# Patient Record
Sex: Male | Born: 1955 | Hispanic: No | Smoking: Former smoker
Health system: Southern US, Community
[De-identification: ages and names within clinical notes are randomized; demographics above are authoritative.]

## PROBLEM LIST (undated history)

## (undated) DIAGNOSIS — F1011 Alcohol abuse, in remission: Secondary | ICD-10-CM

## (undated) DIAGNOSIS — N410 Acute prostatitis: Secondary | ICD-10-CM

## (undated) DIAGNOSIS — B009 Herpesviral infection, unspecified: Secondary | ICD-10-CM

## (undated) DIAGNOSIS — K219 Gastro-esophageal reflux disease without esophagitis: Secondary | ICD-10-CM

## (undated) DIAGNOSIS — D219 Benign neoplasm of connective and other soft tissue, unspecified: Secondary | ICD-10-CM

## (undated) DIAGNOSIS — B351 Tinea unguium: Secondary | ICD-10-CM

## (undated) HISTORY — DX: Tinea unguium: B35.1

## (undated) HISTORY — DX: Alcohol abuse, in remission: F10.11

## (undated) HISTORY — DX: Benign neoplasm of connective and other soft tissue, unspecified: D21.9

## (undated) HISTORY — DX: Acute prostatitis: N41.0

## (undated) HISTORY — DX: Gastro-esophageal reflux disease without esophagitis: K21.9

## (undated) HISTORY — DX: Herpesviral infection, unspecified: B00.9

---

## 2000-06-11 ENCOUNTER — Emergency Department (HOSPITAL_COMMUNITY): Admission: EM | Admit: 2000-06-11 | Discharge: 2000-06-11 | Payer: Self-pay | Admitting: Emergency Medicine

## 2000-06-11 ENCOUNTER — Encounter: Payer: Self-pay | Admitting: Emergency Medicine

## 2005-02-24 ENCOUNTER — Ambulatory Visit: Payer: Self-pay | Admitting: Internal Medicine

## 2005-03-03 ENCOUNTER — Ambulatory Visit: Payer: Self-pay | Admitting: Internal Medicine

## 2005-03-10 ENCOUNTER — Ambulatory Visit: Payer: Self-pay | Admitting: Internal Medicine

## 2005-03-19 ENCOUNTER — Ambulatory Visit: Payer: Self-pay | Admitting: Internal Medicine

## 2005-04-02 ENCOUNTER — Ambulatory Visit: Payer: Self-pay | Admitting: Internal Medicine

## 2005-07-01 ENCOUNTER — Ambulatory Visit: Payer: Self-pay | Admitting: Internal Medicine

## 2005-09-26 ENCOUNTER — Ambulatory Visit: Payer: Self-pay | Admitting: Internal Medicine

## 2006-03-19 ENCOUNTER — Ambulatory Visit: Payer: Self-pay | Admitting: Internal Medicine

## 2007-07-27 ENCOUNTER — Ambulatory Visit: Payer: Self-pay | Admitting: Internal Medicine

## 2007-07-27 LAB — CONVERTED CEMR LAB
ALT: 18 units/L (ref 0–53)
AST: 30 units/L (ref 0–37)
Albumin: 3.9 g/dL (ref 3.5–5.2)
Alkaline Phosphatase: 41 units/L (ref 39–117)
BUN: 9 mg/dL (ref 6–23)
Basophils Absolute: 0 10*3/uL (ref 0.0–0.1)
Basophils Relative: 0.2 % (ref 0.0–1.0)
Bilirubin Urine: NEGATIVE
Bilirubin, Direct: 0.2 mg/dL (ref 0.0–0.3)
CO2: 34 meq/L — ABNORMAL HIGH (ref 19–32)
Calcium: 9.4 mg/dL (ref 8.4–10.5)
Chloride: 102 meq/L (ref 96–112)
Cholesterol: 174 mg/dL (ref 0–200)
Creatinine, Ser: 0.9 mg/dL (ref 0.4–1.5)
Eosinophils Absolute: 0.3 10*3/uL (ref 0.0–0.7)
Eosinophils Relative: 6.5 % — ABNORMAL HIGH (ref 0.0–5.0)
GFR calc Af Amer: 115 mL/min
GFR calc non Af Amer: 95 mL/min
Glucose, Bld: 90 mg/dL (ref 70–99)
HCT: 44.4 % (ref 39.0–52.0)
HDL: 52.9 mg/dL (ref >39.0)
Hemoglobin: 14.6 g/dL (ref 13.0–17.0)
Ketones, urine, test strip: NEGATIVE
LDL Cholesterol: 109 mg/dL — ABNORMAL HIGH (ref 0–99)
Lymphocytes Relative: 41.3 % (ref 12.0–46.0)
MCHC: 33 g/dL (ref 30.0–36.0)
MCV: 93.4 fL (ref 78.0–100.0)
Monocytes Absolute: 0.5 10*3/uL (ref 0.1–1.0)
Monocytes Relative: 11.1 % (ref 3.0–12.0)
Neutro Abs: 1.8 10*3/uL (ref 1.4–7.7)
Neutrophils Relative %: 40.9 % — ABNORMAL LOW (ref 43.0–77.0)
Nitrite: NEGATIVE
PSA: 0.47 ng/mL (ref 0.10–4.00)
Platelets: 172 10*3/uL (ref 150–400)
Potassium: 3.8 meq/L (ref 3.5–5.1)
Protein, U semiquant: NEGATIVE
RBC: 4.75 M/uL (ref 4.22–5.81)
RDW: 12.3 % (ref 11.5–14.6)
Sodium: 140 meq/L (ref 135–145)
Specific Gravity, Urine: 1.01
TSH: 0.95 microintl units/mL (ref 0.35–5.50)
Total Bilirubin: 1.8 mg/dL — ABNORMAL HIGH (ref 0.3–1.2)
Total CHOL/HDL Ratio: 3.3
Total Protein: 6.8 g/dL (ref 6.0–8.3)
Triglycerides: 63 mg/dL (ref 0–149)
Urobilinogen, UA: 0.2
VLDL: 13 mg/dL (ref 0–40)
WBC Urine, dipstick: NEGATIVE
WBC: 4.5 10*3/uL (ref 4.5–10.5)
pH: 7.5

## 2007-07-28 ENCOUNTER — Telehealth: Payer: Self-pay | Admitting: Internal Medicine

## 2007-08-03 ENCOUNTER — Ambulatory Visit: Payer: Self-pay | Admitting: Internal Medicine

## 2007-08-11 ENCOUNTER — Ambulatory Visit: Payer: Self-pay | Admitting: Gastroenterology

## 2007-08-12 ENCOUNTER — Encounter: Payer: Self-pay | Admitting: Family Medicine

## 2007-10-18 ENCOUNTER — Ambulatory Visit: Payer: Self-pay | Admitting: Internal Medicine

## 2007-10-18 DIAGNOSIS — B351 Tinea unguium: Secondary | ICD-10-CM

## 2007-10-18 HISTORY — DX: Tinea unguium: B35.1

## 2007-10-18 LAB — CONVERTED CEMR LAB
ALT: 21 units/L (ref 0–53)
AST: 39 units/L — ABNORMAL HIGH (ref 0–37)
Albumin: 3.8 g/dL (ref 3.5–5.2)
Alkaline Phosphatase: 50 units/L (ref 39–117)
Bilirubin, Direct: 0.1 mg/dL (ref 0.0–0.3)
Total Bilirubin: 1.2 mg/dL (ref 0.3–1.2)
Total Protein: 6.5 g/dL (ref 6.0–8.3)

## 2008-01-26 ENCOUNTER — Telehealth: Payer: Self-pay | Admitting: Internal Medicine

## 2008-03-16 ENCOUNTER — Telehealth: Payer: Self-pay | Admitting: Internal Medicine

## 2008-03-17 ENCOUNTER — Ambulatory Visit: Payer: Self-pay | Admitting: Internal Medicine

## 2008-03-17 DIAGNOSIS — N410 Acute prostatitis: Secondary | ICD-10-CM

## 2008-03-17 DIAGNOSIS — N342 Other urethritis: Secondary | ICD-10-CM | POA: Insufficient documentation

## 2008-03-17 HISTORY — DX: Acute prostatitis: N41.0

## 2008-03-17 LAB — CONVERTED CEMR LAB
Chlamydia, DNA Probe: NEGATIVE
GC Probe Amp, Genital: NEGATIVE

## 2008-03-20 ENCOUNTER — Telehealth: Payer: Self-pay | Admitting: Internal Medicine

## 2008-04-19 ENCOUNTER — Telehealth: Payer: Self-pay | Admitting: Internal Medicine

## 2008-05-01 ENCOUNTER — Telehealth: Payer: Self-pay | Admitting: Internal Medicine

## 2008-08-11 ENCOUNTER — Ambulatory Visit: Payer: Self-pay | Admitting: Internal Medicine

## 2008-08-11 DIAGNOSIS — F528 Other sexual dysfunction not due to a substance or known physiological condition: Secondary | ICD-10-CM | POA: Insufficient documentation

## 2008-08-23 ENCOUNTER — Encounter: Payer: Self-pay | Admitting: Internal Medicine

## 2008-08-30 ENCOUNTER — Ambulatory Visit: Payer: Self-pay | Admitting: Gastroenterology

## 2009-02-05 ENCOUNTER — Telehealth: Payer: Self-pay | Admitting: Internal Medicine

## 2009-07-03 ENCOUNTER — Telehealth: Payer: Self-pay | Admitting: Internal Medicine

## 2009-07-03 ENCOUNTER — Ambulatory Visit: Payer: Self-pay | Admitting: Internal Medicine

## 2009-07-06 LAB — CONVERTED CEMR LAB
Chlamydia, DNA Probe: NEGATIVE
GC Probe Amp, Genital: NEGATIVE

## 2009-07-11 ENCOUNTER — Telehealth: Payer: Self-pay | Admitting: Internal Medicine

## 2009-08-29 ENCOUNTER — Telehealth: Payer: Self-pay | Admitting: Internal Medicine

## 2009-09-19 ENCOUNTER — Ambulatory Visit: Payer: Self-pay | Admitting: Internal Medicine

## 2009-09-19 DIAGNOSIS — D219 Benign neoplasm of connective and other soft tissue, unspecified: Secondary | ICD-10-CM

## 2009-09-19 HISTORY — DX: Benign neoplasm of connective and other soft tissue, unspecified: D21.9

## 2009-12-05 ENCOUNTER — Ambulatory Visit: Payer: Self-pay | Admitting: Family Medicine

## 2009-12-19 ENCOUNTER — Ambulatory Visit: Payer: Self-pay | Admitting: Internal Medicine

## 2009-12-19 LAB — CONVERTED CEMR LAB
ALT: 17 units/L (ref 0–53)
BUN: 10 mg/dL (ref 6–23)
Basophils Absolute: 0 10*3/uL (ref 0.0–0.1)
Bilirubin Urine: NEGATIVE
Blood in Urine, dipstick: NEGATIVE
Chloride: 102 meq/L (ref 96–112)
Cholesterol: 183 mg/dL (ref 0–200)
Creatinine, Ser: 0.8 mg/dL (ref 0.4–1.5)
Eosinophils Absolute: 0.3 10*3/uL (ref 0.0–0.7)
Eosinophils Relative: 7.1 % — ABNORMAL HIGH (ref 0.0–5.0)
Glucose, Bld: 77 mg/dL (ref 70–99)
Glucose, Urine, Semiquant: NEGATIVE
HCT: 46.4 % (ref 39.0–52.0)
Ketones, urine, test strip: NEGATIVE
Lymphs Abs: 2.1 10*3/uL (ref 0.7–4.0)
MCHC: 33.9 g/dL (ref 30.0–36.0)
MCV: 95.4 fL (ref 78.0–100.0)
Monocytes Absolute: 0.4 10*3/uL (ref 0.1–1.0)
Neutrophils Relative %: 36.9 % — ABNORMAL LOW (ref 43.0–77.0)
Nitrite: NEGATIVE
PSA: 0.46 ng/mL (ref 0.10–4.00)
Platelets: 167 10*3/uL (ref 150.0–400.0)
Potassium: 4.9 meq/L (ref 3.5–5.1)
Protein, U semiquant: NEGATIVE
RDW: 12.3 % (ref 11.5–14.6)
Specific Gravity, Urine: 1.02
TSH: 0.57 microintl units/mL (ref 0.35–5.50)
Total Bilirubin: 1.6 mg/dL — ABNORMAL HIGH (ref 0.3–1.2)
Triglycerides: 30 mg/dL (ref 0.0–149.0)
Urobilinogen, UA: 0.2
WBC Urine, dipstick: NEGATIVE
pH: 8.5

## 2009-12-26 ENCOUNTER — Ambulatory Visit: Payer: Self-pay | Admitting: Internal Medicine

## 2010-01-02 ENCOUNTER — Encounter (INDEPENDENT_AMBULATORY_CARE_PROVIDER_SITE_OTHER): Payer: Self-pay | Admitting: *Deleted

## 2010-01-29 ENCOUNTER — Telehealth: Payer: Self-pay | Admitting: Internal Medicine

## 2010-02-14 ENCOUNTER — Encounter (INDEPENDENT_AMBULATORY_CARE_PROVIDER_SITE_OTHER): Payer: Self-pay | Admitting: *Deleted

## 2010-02-18 ENCOUNTER — Ambulatory Visit: Payer: Self-pay | Admitting: Gastroenterology

## 2010-03-04 ENCOUNTER — Ambulatory Visit: Payer: Self-pay | Admitting: Gastroenterology

## 2010-05-14 NOTE — Assessment & Plan Note (Signed)
Summary: cpx   Vital Signs:  Patient profile:   55 year old male Height:      66 inches Weight:      146 pounds BMI:     23.65 Temp:     98.2 degrees F oral Pulse rate:   68 / minute Resp:     14 per minute BP sitting:   110 / 70  (left arm)  Vitals Entered By: Willy Eddy, LPN (December 26, 2009 2:20 PM) CC: cpx- no colonoscopy--order in for colonsocopy with dr stark Is Patient Diabetic? No   CC:  cpx- no colonoscopy--order in for colonsocopy with dr stark.  History of Present Illness: The pt was asked about all immunizations, health maint. services that are appropriate to their age and was given guidance on diet exercize  and weight management labs were reviewed with pt  Preventive Screening-Counseling & Management  Alcohol-Tobacco     Smoking Status: never     Tobacco Counseling: not indicated; no tobacco use  Problems Prior to Update: 1)  Acute Bronchitis  (ICD-466.0) 2)  Fibroma  (ICD-215.9) 3)  Onychomycosis  (ICD-110.1) 4)  Contact With or Exposure To Venereal Diseases  (ICD-V01.6) 5)  Pityriasis Versicolor  (ICD-111.0) 6)  Erectile Dysfunction  (ICD-302.72) 7)  Acute Prostatitis  (ICD-601.0) 8)  Urethritis  (ICD-597.80) 9)  Uns Advrs Eff Uns Rx Medicinal&biological Sbstnc  (ICD-995.20) 10)  Onychomycosis  (ICD-110.1) 11)  Routine General Medical Exam@health  Care Facl  (ICD-V70.0) 12)  Physical Examination  (ICD-V70.0)  Medications Prior to Update: 1)  Valtrex 500 Mg  Tabs (Valacyclovir Hcl) .... Once Daily Prn 2)  Wellbutrin Xl 150 Mg  Tb24 (Bupropion Hcl) .... Once Daily 3)  Prilosec 20 Mg Cpdr (Omeprazole) .Marland Kitchen.. 1 Once Daily As Needed 4)  Clotrimazole-Betamethasone 1-0.05 % Crea (Clotrimazole-Betamethasone) .... Apply To Rash Two Times A Day 5)  Cialis 5 Mg Tabs (Tadalafil) .... Take As Directed 6)  Lamictal 100 Mg Tabs (Lamotrigine) .Marland Kitchen.. 1 Once Daily 7)  Seroquel 200 Mg Tabs (Quetiapine Fumarate) .Marland Kitchen.. 1 Once Daily 8)  Zithromax 250 Mg Tabs  (Azithromycin) .... 2 Tabs By Mouth Once and Then 1 Tab By Mouth Once Daily X 4 Days 9)  Tussionex Pennkinetic Er 8-10 Mg/43ml Lqcr (Chlorpheniramine-Hydrocodone) .Marland Kitchen.. 1 Tsp By Mouth Two Times A Day As Needed Cough  Current Medications (verified): 1)  Valtrex 500 Mg  Tabs (Valacyclovir Hcl) .... Once Daily Prn 2)  Wellbutrin Xl 150 Mg  Tb24 (Bupropion Hcl) .... Once Daily 3)  Prilosec 20 Mg Cpdr (Omeprazole) .Marland Kitchen.. 1 Once Daily As Needed 4)  Clotrimazole-Betamethasone 1-0.05 % Crea (Clotrimazole-Betamethasone) .... Apply To Rash Two Times A Day 5)  Cialis 5 Mg Tabs (Tadalafil) .... Take As Directed 6)  Lamotrigine 200 Mg Tabs (Lamotrigine) .... One By Mouth Daily 7)  Seroquel 50 Mg Tabs (Quetiapine Fumarate) .... One By Mouth Q Hs As Needed Slepp 8)  Clonazepam 1 Mg Tabs (Clonazepam) .... 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  Past Medical History: Reviewed history from 08/03/2007 and no changes required. HSV hx of ETOH abuse  Past Surgical History: Reviewed history from 10/18/2007 and no changes required. Denies surgical history  Family History:  Reviewed history from 03/17/2008 and no changes required. n/a  Social History: Reviewed history from 08/03/2007 and no changes required. 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         Flu Vaccine Consent Questions     Do you have a history of severe allergic reactions to this vaccine? no    Any prior history of allergic reactions to egg and/or gelatin? no    Do you have a sensitivity to the preservative Thimersol? no    Do you have a past history of Guillan-Barre Syndrome? no    Do you currently have an acute febrile illness? no    Have you ever had a severe reaction to latex? no    Vaccine information given and explained to patient? yes    Are you currently pregnant? no    Lot Number:[id number]   Exp Date:10/12/2010   Site Given  Left Deltoid IM   Physical Exam  General:  Well-developed,well-nourished,in no acute distress; alert,appropriate and cooperative throughout examination Head:  Normocephalic and atraumatic without obvious abnormalities. No apparent alopecia or balding. Eyes:  No corneal or conjunctival inflammation noted.  Ears:  External ear exam shows no significant lesions or deformities.  Otoscopic examination reveals clear canals, tympanic membranes are intact bilaterally without bulging, retraction, inflammation or discharge. Hearing is grossly normal bilaterally. Nose:  External nasal examination shows no deformity or inflammation. Nasal mucosa are pink and moist without lesions or exudates. Mouth:  Oral mucosa and oropharynx without lesions or exudates.  Teeth in good repair. MMM, mild erythema noted in posterior oropharynx Neck:  No deformities, masses, or tenderness noted. Chest Wall:  No deformities, masses, tenderness or gynecomastia noted. Lungs:  Normal respiratory effort, chest expands symmetrically. Lungs are clear to auscultation, no crackles or wheezes. Heart:  Normal rate and regular rhythm.  S1 and S2 normal without gallop, murmur, click, rub or other extra sounds. Abdomen:  Bowel sounds positive,abdomen soft and non-tender without masses, organomegaly or hernias noted. Genitalia:  circumcised and urethral discharge.   Extremities:  No clubbing, cyanosis, edema, or deformity noted with normal full range of motion of all joints.   Neurologic:  No cranial nerve deficits noted. Station and gait are normal. Plantar reflexes are down-going bilaterally. DTRs are symmetrical throughout. Sensory, motor and coordinative functions appear intact.   Impression & Recommendations:  Problem # 1:  PHYSICAL EXAMINATION (ICD-V70.0) The pt was asked about all immunizations, health maint. services that are appropriate to their age and was given guidance on diet exercize  and weight management  Orders: Gastroenterology Referral (GI)  Td Booster: Tdap (08/03/2007)   Flu Vax: Fluvax 3+ (12/26/2009)   Chol: 183 (12/19/2009)   HDL: 66.50 (12/19/2009)   LDL: 111 (12/19/2009)   TG: 30.0 (12/19/2009) TSH: 0.57 (12/19/2009)   PSA: 0.46 (12/19/2009)  Discussed using sunscreen, use of alcohol, drug use, self testicular exam, routine dental care, routine eye care, routine physical exam, seat belts, multiple vitamins, osteoporosis prevention, adequate calcium intake in diet, and recommendations for immunizations.  Discussed exercise and checking cholesterol.  Discussed gun safety, safe sex, and contraception. Also recommend checking PSA.  Problem #  2:  ERECTILE DYSFUNCTION (ICD-302.72) Assessment: Unchanged  His updated medication list for this problem includes:    Cialis 5 Mg Tabs (Tadalafil) .Marland Kitchen... Take as directed  Discussed proper use of medications, as well as side effects.   Complete Medication List: 1)  Valtrex 500 Mg Tabs (Valacyclovir hcl) .... Once daily prn 2)  Wellbutrin Xl 150 Mg Tb24 (Bupropion hcl) .... Once daily 3)  Prilosec 20 Mg Cpdr (Omeprazole) .Marland Kitchen.. 1 once daily as needed 4)   Clotrimazole-betamethasone 1-0.05 % Crea (Clotrimazole-betamethasone) .... Apply to rash two times a day 5)  Cialis 5 Mg Tabs (Tadalafil) .... Take as directed 6)  Lamotrigine 200 Mg Tabs (Lamotrigine) .... One by mouth daily 7)  Seroquel 50 Mg Tabs (Quetiapine fumarate) .... One by mouth q hs as needed slepp 8)  Clonazepam 1 Mg Tabs (Clonazepam) .... One by mouth q hs prn  Other Orders: Admin 1st Vaccine (60454) Flu Vaccine 5yrs + (09811)  Patient Instructions: 1)  Please schedule a follow-up appointment in 6 months. Prescriptions: LAMOTRIGINE 200 MG TABS (LAMOTRIGINE) one by mouth daily  #30 x 6   Entered and Authorized by:   Stacie Glaze MD   Signed by:   Stacie Glaze MD on 12/26/2009   Method used:   [facility] to        [facility] [address] [phone]* (retail)       [address]       [place], [place]  [address]       Ph: [phone] or [phone]       Fax: [phone]   RxID:   [id number] SEROQUEL 50 MG TABS (QUETIAPINE FUMARATE) one by mouth q HS as needed slepp  #30 x 6   Entered and Authorized by:   Stacie Glaze MD   Signed by:   Stacie Glaze MD on 12/26/2009   Method used:   [facility]ically to        [facility] [address] [phone]* (retail)       [address]       [place], [place]  [address]       Ph: [phone] or [phone]       Fax: [phone]   RxID:   [id number] CLONAZEPAM 1 MG TABS (CLONAZEPAM) one by mouth q HS prn  #30 x 3   Entered and Authorized by:   Stacie Glaze MD   Signed by:   Stacie Glaze MD on 12/26/2009   Method used:   Print then Give to Patient   RxID:   [id number] WELLBUTRIN XL 150 MG  TB24 (BUPROPION HCL) once daily  #30.0 Tablet x 5   Entered and Authorized by:   Stacie Glaze MD   Signed by:   Stacie Glaze MD on 12/26/2009   Method used:   [facility]        [facility] [address] [phone]* (retail)       [address]       [place], [place]  [address]       Ph: [phone] or  [phone]       Fax: [phone]   RxID:   [id number]

## 2010-05-14 NOTE — Progress Notes (Signed)
Summary: continued dysuria  Phone Note From Other Clinic   Summary of Call: Pt finished the antibiotics Monday but is still having some dysuria. [facility] [phone] Initial call taken by: Lynann Beaver CMA,  July 11, 2009 5:01 PM Reason for Call: Need Referral Information  Follow-up for Phone Call        per dr Lovell Sheehan- probable prostatits-give cipro 500 two times a day for 21 days Follow-up by: Willy Eddy, LPN,  July 12, 2009 9:24 AM    New/Updated Medications: CIPRO 500 MG TABS (CIPROFLOXACIN HCL) 1 two times a day for 21 days Prescriptions: CIPRO 500 MG TABS (CIPROFLOXACIN HCL) 1 two times a day for 21 days  #42 x 0   Entered by:   Willy Eddy, LPN   Authorized by:   Stacie Glaze MD   Signed by:   Willy Eddy, LPN on 11/91/4782   Method used:   [facility]ically [facility]        [facility] [address] [phone]* (retail)       [address]       [place], [place]  [address]       Ph: [phone] or [phone]       Fax: [phone]   RxID:   [id number]  pt informed

## 2010-05-14 NOTE — Progress Notes (Signed)
Summary: [facility] [employer]. Increase pts Wellbutrin  Phone Note From Other Clinic Call back at [phone] ext [phone]  Thora Lance -Psychotherapist.    Caller: [facility] Summary of Call: Met with pt this morning. Pt mentioned the possiblilty of increasing the Wellbutrin dose. Psychotherapist agrees to getting med increased.  Initial call taken by: Lucy Antigua,  January 29, 2010 11:46 AM    New/Updated Medications: WELLBUTRIN XL 300 MG XR24H-TAB (BUPROPION HCL) 1 once daily Prescriptions: WELLBUTRIN XL 300 MG XR24H-TAB (BUPROPION HCL) 1 once daily  #30 x 6   Entered by:   Willy Eddy, LPN   Authorized by:   Stacie Glaze MD   Signed by:   Willy Eddy, LPN on 13/11/6576   Method used:   [facility]        [facility] [address] [phone]* (retail)       [address]       [place], [place]  [address]       Ph: [phone] or [phone]       Fax: [phone]   RxID:   [id number]

## 2010-05-14 NOTE — Letter (Signed)
Summary: Moviprep Instructions  [facility]  [address]   [place], [place] [address]   Phone: [phone]  Fax: [phone]       [name]    Jun 26, 1955    MRN: [id number]        Procedure Day /Date: Monday, 03-04-10     Arrival Time: 9:00 a.m.     Procedure Time: 10:00 a.m.     Location of Procedure:                    x   [facility] (4th Floor)  PREPARATION FOR COLONOSCOPY WITH MOVIPREP   Starting 5 days prior to your procedure 02-27-10 do not eat nuts, seeds, popcorn, corn, beans, peas,  salads, or any raw vegetables.  Do not take any fiber supplements (e.g. Metamucil, Citrucel, and Benefiber).  THE DAY BEFORE YOUR PROCEDURE         DATE: 03-03-10   DAY: Sunday  1.  Drink clear liquids the entire day-NO SOLID FOOD  2.  Do not drink anything colored red or purple.  Avoid juices with pulp.  No orange juice.  3.  Drink at least 64 oz. (8 glasses) of fluid/clear liquids during the day to prevent dehydration and help the prep work efficiently.  CLEAR LIQUIDS INCLUDE: Water Jello Ice Popsicles Tea (sugar ok, no milk/cream) Powdered fruit flavored drinks Coffee (sugar ok, no milk/cream) Gatorade Juice: apple, white grape, white cranberry  Lemonade Clear bullion, consomm, broth Carbonated beverages (any kind) Strained chicken noodle soup Hard Candy                             4.  In the morning, mix first dose of MoviPrep solution:    Empty 1 Pouch A and 1 Pouch B into the disposable container    Add lukewarm drinking water to the top line of the container. Mix to dissolve    Refrigerate (mixed solution should be used within 24 hrs)  5.  Begin drinking the prep at 5:00 p.m. The MoviPrep container is divided by 4 marks.   Every 15 minutes drink the solution down to the next mark (approximately 8 oz) until the full liter is complete.   6.  Follow completed prep with 16 oz of clear liquid of your choice (Nothing red or purple).   Continue to drink clear liquids until bedtime.  7.  Before going to bed, mix second dose of MoviPrep solution:    Empty 1 Pouch A and 1 Pouch B into the disposable container    Add lukewarm drinking water to the top line of the container. Mix to dissolve    Refrigerate  THE DAY OF YOUR PROCEDURE      DATE: 03-04-10  DAY: Monday  Beginning at 5:00 a.m. (5 hours before procedure):         1. Every 15 minutes, drink the solution down to the next mark (approx 8 oz) until the full liter is complete.  2. Follow completed prep with 16 oz. of clear liquid of your choice.    3. You may drink clear liquids until  8:00 a.m. (2 HOURS BEFORE PROCEDURE).   MEDICATION INSTRUCTIONS  Unless otherwise instructed, you should take regular prescription medications with a small sip of water   as early as possible the morning of your procedure.           OTHER INSTRUCTIONS  You will need a  responsible adult at least 53 years of age to accompany you and drive you home.   This person must remain in the waiting room during your procedure.  Wear loose fitting clothing that is easily removed.  Leave jewelry and other valuables at home.  However, you may wish to bring a book to read or  an iPod/MP3 player to listen to music as you wait for your procedure to start.  Remove all body piercing jewelry and leave at home.  Total time from sign-in until discharge is approximately 2-3 hours.  You should go home directly after your procedure and rest.  You can resume normal activities the  day after your procedure.  The day of your procedure you should not:   Drive   Make legal decisions   Operate machinery   Drink alcohol   Return to work  You will receive specific instructions about eating, activities and medications before you leave.    The above instructions have been reviewed and explained to me by   Ezra Sites RN  February 18, 2010 3:41 PM    I fully understand and can  verbalize these instructions _____________________________ Date _________

## 2010-05-14 NOTE — Progress Notes (Signed)
Summary: more itches  Phone Note Call from Patient Call back at Home Phone [phone]   Reason for Call: Acute Illness Summary of Call: Brownish black mole under hair temple that itches, 2 one above the other.  Dr. Shela Commons looked at the one before & said it is okay.  OV you or see dermatologist?  Would need referral.  Initial call taken by: Rudy Jew, RN,  Aug 29, 2009 10:38 AM  Follow-up for Phone Call        ov given Follow-up by: Willy Eddy, LPN,  Aug 29, 2009 10:56 AM

## 2010-05-14 NOTE — Assessment & Plan Note (Signed)
Summary: ST, COUGH, FEVER // RS   Vital Signs:  Patient profile:   55 year old male Height:      66 inches (167.64 cm) Weight:      145.31 pounds (66.05 kg) O2 Sat:      98 % on Room air Temp:     97.8 degrees F (36.56 degrees C) oral Pulse rate:   69 / minute BP sitting:   110 / 82  (left arm)  Vitals Entered By: Josph Macho RMA (December 05, 2009 10:17 AM)  O2 Flow:  Room air CC: Cough, fever X6 days/ CF Is Patient Diabetic? No   History of Present Illness: Patient in today for evaluation of a 6 day history of sore throat, cough, malaise, and low grade fevers. Initially he noted significant head congestion/clear rhinorrhea and a dry cough. Now the cough is productive of clear sputum and has moved deeper into the chest. Myalgias/fatigue have set in. Frontal HA and chills have been noted. Had some mild nausea once several days ago but that has resolved. Anorexia is noted but he continues to move his bowels daily without difficulty  Current Medications (verified): 1)  Valtrex 500 Mg  Tabs (Valacyclovir Hcl) .... Once Daily Prn 2)  Wellbutrin Xl 150 Mg  Tb24 (Bupropion Hcl) .... Once Daily 3)  Prilosec 20 Mg Cpdr (Omeprazole) .Marland Kitchen.. 1 Once Daily As Needed 4)  Clotrimazole-Betamethasone 1-0.05 % Crea (Clotrimazole-Betamethasone) .... Apply To Rash Two Times A Day 5)  Cialis 5 Mg Tabs (Tadalafil) .... Take As Directed 6)  Lamictal 100 Mg Tabs (Lamotrigine) .Marland Kitchen.. 1 Once Daily 7)  Seroquel 200 Mg Tabs (Quetiapine Fumarate) .Marland Kitchen.. 1 Once Daily 8)  Terbinafine Hcl 250 Mg Tabs (Terbinafine Hcl) .... One By Mouth Daily For 90 Days  Allergies (verified): No Known Drug Allergies  Past History:  Past medical history reviewed for relevance to current acute and chronic problems. Social history (including risk factors) reviewed for relevance to current acute and chronic problems.  Past Medical History: Reviewed history from 08/03/2007 and no changes required. HSV hx of ETOH abuse  Social  History: Reviewed history from 08/03/2007 and no changes required. 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 No apparent alopecia or balding. Eyes:  No corneal or conjunctival inflammation noted.  Ears:  External ear exam shows no significant lesions or deformities.  Otoscopic examination reveals clear canals, tympanic membranes are intact bilaterally without bulging, retraction, inflammation or discharge. Hearing is grossly normal bilaterally. Nose:  External nasal examination shows no deformity or inflammation. Nasal mucosa are pink and moist without lesions or exudates. Mouth:  Oral mucosa and oropharynx without lesions or exudates.  Teeth in good repair. MMM, mild erythema noted in posterior oropharynx Neck:  No deformities, masses, or tenderness noted. Lungs:  Normal respiratory effort, chest expands symmetrically. Lungs are clear to auscultation, no crackles or wheezes. Heart:  Normal rate and regular rhythm. S1 and S2 normal without gallop, murmur, click, rub or other extra sounds. Abdomen:  Bowel sounds positive,abdomen soft and non-tender without masses, organomegaly or hernias noted. Extremities:  No clubbing, cyanosis, edema, or deformity noted  Cervical Nodes:  No lymphadenopathy noted Psych:  Cognition and judgment appear intact. Alert and cooperative with normal attention span and concentration. No apparent delusions, illusions, hallucinations   Impression & Recommendations:  Problem #  1:  ACUTE BRONCHITIS (ICD-466.0)  His updated medication list for this problem includes:    Zithromax 250 Mg Tabs (Azithromycin) .Marland Kitchen... 2 tabs by mouth once and then 1 tab by mouth once daily x 4 days    Tussionex Pennkinetic Er 8-10 Mg/15ml Lqcr  (Chlorpheniramine-hydrocodone) .Marland Kitchen... 1 tsp by mouth two times a day as needed cough Push fluids, increase rest, start Mucinex two times a day x 10 days and report persistent symptoms for further evaluation  Complete Medication List: 1)  Valtrex 500 Mg Tabs (Valacyclovir hcl) .... Once daily prn 2)  Wellbutrin Xl 150 Mg Tb24 (Bupropion hcl) .... Once daily 3)  Prilosec 20 Mg Cpdr (Omeprazole) .Marland Kitchen.. 1 once daily as needed 4)  Clotrimazole-betamethasone 1-0.05 % Crea (Clotrimazole-betamethasone) .... Apply to rash two times a day 5)  Cialis 5 Mg Tabs (Tadalafil) .... Take as directed 6)  Lamictal 100 Mg Tabs (Lamotrigine) .Marland Kitchen.. 1 once daily 7)  Seroquel 200 Mg Tabs (Quetiapine fumarate) .Marland Kitchen.. 1 once daily 8)  Terbinafine Hcl 250 Mg Tabs (Terbinafine hcl) .... One by mouth daily for 90 days 9)  Zithromax 250 Mg Tabs (Azithromycin) .... 2 tabs by mouth once and then 1 tab by mouth once daily x 4 days 10)  Tussionex Pennkinetic Er 8-10 Mg/77ml Lqcr (Chlorpheniramine-hydrocodone) .Marland Kitchen.. 1 tsp by mouth two times a day as needed cough  Patient Instructions: 1)  Please schedule a follow-up appointment as needed .  2)  Take your antibiotic as prescribed until ALL of it is gone, but stop if you develop a rash or swelling and contact our office as soon as possible.  3)  Acute Bronchitis symptoms for less then 10 days are not  helped by antibiotics. Take over the counter cough medications. Call if no improvement in 5-7 days, sooner if increasing cough, fever, or new symptoms ( shortness of breath, chest pain) .  4)  Take Mucinex two times a day x 10 days 5)  Consider a probiotic daily for the next month Prescriptions: TUSSIONEX PENNKINETIC ER 8-10 MG/5ML LQCR (CHLORPHENIRAMINE-HYDROCODONE) 1 tsp by mouth two times a day as needed cough  #4 oz x 1   Entered and Authorized by:   Danise Edge MD   Signed by:   Danise Edge MD on 12/05/2009   Method used:   Print then Give to Patient   RxID:    [id number] ZITHROMAX 250 MG TABS (AZITHROMYCIN) 2 tabs by mouth once and then 1 tab by mouth once daily x 4 days  #6 x 0   Entered and Authorized by:   Danise Edge MD   Signed by:   Danise Edge MD on 12/05/2009   Method used:   [facility] to        [facility]  [address] [phone]* (retail)       [address]       [place], [place]  [address]       Ph: [phone] or [phone]       Fax: [phone]   RxID:   [id number]

## 2010-05-14 NOTE — Assessment & Plan Note (Signed)
Summary: possible mole removal/bmw   Vital Signs:  Patient profile:   55 year old male Height:      66 inches Weight:      146 pounds BMI:     23.65 Temp:     98.2 degrees F oral Pulse rate:   64 / minute Resp:     14 per minute BP sitting:   110 / 70  (left arm)  Vitals Entered By: Willy Eddy, LPN (September 19, 1608 4:37 PM) CC: check moles on temples   CC:  check moles on temples.  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) 5)  Erectile Dysfunction  (ICD-302.72) 6)  Acute Prostatitis  (ICD-601.0) 7)  Urethritis  (ICD-597.80) 8)  Uns Advrs Eff Uns Rx Medicinal&biological Sbstnc  (ICD-995.20) 9)  Onychomycosis  (ICD-110.1) 10)  Routine General Medical Exam@health  Care Facl  (ICD-V70.0) 11)  Physical Examination  (ICD-V70.0)  Current Problems (verified): 1)  Contact With or Exposure To Venereal Diseases  (ICD-V01.6) 2)  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 Once Daily Prn 2)  Wellbutrin Xl 150 Mg  Tb24 (Bupropion Hcl) .... Once Daily 3)  Prilosec 20 Mg Cpdr (Omeprazole) .Marland Kitchen.. 1 Once Daily As  Needed 4)  Clotrimazole-Betamethasone 1-0.05 % Crea (Clotrimazole-Betamethasone) .... Apply To Rash Two Times A Day 5)  Cialis 5 Mg Tabs (Tadalafil) .... Take As Directed 6)  Lamictal 100 Mg Tabs (Lamotrigine) .Marland Kitchen.. 1 Once Daily 7)  Seroquel 200 Mg Tabs (Quetiapine Fumarate) .Marland Kitchen.. 1 Once Daily 8)  Ofloxacin 400 Mg Tabs (Ofloxacin) .... One By Mouth Daily For 7 Days 9)  Cipro 500 Mg Tabs (Ciprofloxacin Hcl) .Marland Kitchen.. 1 Two Times A Day For 21 Days  Current Medications (verified): 1)  Valtrex 500 Mg  Tabs (Valacyclovir Hcl) .... Once Daily Prn 2)  Wellbutrin Xl 150 Mg  Tb24 (Bupropion Hcl) .... Once Daily 3)  Prilosec 20 Mg Cpdr (Omeprazole) .Marland Kitchen.. 1 Once Daily As Needed 4)  Clotrimazole-Betamethasone 1-0.05 % Crea (Clotrimazole-Betamethasone) .... Apply To Rash Two Times A Day 5)  Cialis 5 Mg Tabs (Tadalafil) .... Take As Directed 6)  Lamictal 100 Mg Tabs (Lamotrigine) .Marland Kitchen.. 1 Once Daily 7)  Seroquel 200 Mg Tabs (Quetiapine Fumarate) .Marland Kitchen.. 1 Once Daily 8)  Terbinafine Hcl 250 Mg Tabs (Terbinafine Hcl) .... 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  Past Medical History: Reviewed history from 08/03/2007 and no changes required. HSV hx of ETOH abuse  Past Surgical History: Reviewed history from 10/18/2007 and no changes required. Denies surgical history  Family History: Reviewed history from 03/17/2008 and no changes required. n/a  Social History: Reviewed history from 08/03/2007 and no changes required. 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  The patient denies anorexia, fever, weight loss, weight gain, vision loss, decreased hearing, hoarseness, chest pain, syncope, dyspnea on exertion, peripheral edema, prolonged cough, headaches, hemoptysis, abdominal pain, melena, hematochezia, severe indigestion/heartburn, hematuria, incontinence, genital sores, muscle weakness, transient blindness, difficulty walking, depression, unusual weight change, abnormal bleeding, enlarged lymph nodes, angioedema, and breast masses.    Physical Exam  General:  alert and well-nourished.   Head:  normocephalic and atraumatic.   Eyes:  No corneal or conjunctival inflammation noted. EOMI. Perrla. Funduscopic exam benign, without hemorrhages, exudates or papilledema. Vision grossly normal. Ears:  External ear exam shows no significant lesions or deformities.  Otoscopic examination reveals clear canals, tympanic membranes are intact bilaterally without bulging, retraction, inflammation or discharge. Hearing is grossly normal bilaterally. Neck:  No deformities, masses, or tenderness noted. Lungs:  normal respiratory effort, no crackles, and no wheezes.   Heart:  normal rate and regular rhythm.   Abdomen:  soft and non-tender.   Extremities:  fungal nails Neurologic:  cranial nerves II-XII intact.   Skin:  fibroma on skin Cervical Nodes:  No lymphadenopathy noted Axillary Nodes:  No palpable lymphadenopathy Inguinal Nodes:  No significant adenopathy Psych:  Cognition and judgment appear intact. Alert and cooperative with normal attention span and concentration. No apparent delusions, illusions, hallucinations   Impression & Recommendations:  Problem # 1:  ONYCHOMYCOSIS (ICD-110.1) Assessment New the pt has painfull raqised fungal nails His updated medication list for this problem includes:    Clotrimazole-betamethasone 1-0.05 % Crea (Clotrimazole-betamethasone) .Marland Kitchen... Apply to rash two times a day    Terbinafine Hcl 250 Mg Tabs (Terbinafine hcl)  ..... One by mouth daily for 90 days  Discussed nail care and medication treatment options.   Problem # 2:  FIBROMA (ICD-215.9) Assessment: New the pt had an irritated fibroma on his arm pt was preped in a sterile manor and informed consent obtained. Using a 15 blade the lesion was removed and sent for pathology. sterile dressings were applied  and wound care discussed with the pt.  Orders: Excise other (benign) lesion (FEENLM), 0.6 - 1.0 cm (11441)  Complete Medication List: 1)  Valtrex 500 Mg Tabs (Valacyclovir hcl) .... Once daily prn 2)  Wellbutrin Xl 150 Mg Tb24 (Bupropion hcl) .... Once daily 3)  Prilosec 20 Mg Cpdr (Omeprazole) .Marland Kitchen.. 1 once daily as needed 4)  Clotrimazole-betamethasone 1-0.05 % Crea (Clotrimazole-betamethasone) .... Apply to rash two times a day 5)  Cialis 5 Mg Tabs (Tadalafil) .... Take as directed 6)  Lamictal 100 Mg Tabs (Lamotrigine) .Marland Kitchen.. 1 once daily 7)  Seroquel 200 Mg Tabs (Quetiapine fumarate) .Marland Kitchen.. 1 once daily 8)  Terbinafine Hcl 250 Mg Tabs (Terbinafine hcl) .... One by mouth daily for 90 days  Patient Instructions: 1)  sept CPX Prescriptions: TERBINAFINE HCL 250 MG TABS (TERBINAFINE HCL) one by mouth daily for 90 days  #90 x 0   Entered by:   Willy Eddy, LPN   Authorized by:   Stacie Glaze MD   Signed by:   Rushie Goltz  Artelia Laroche, LPN on 87/56/4332   Method used:   Electronically to        [facility]. [phone]* (retail)       [address]       [place], [place]  [address]       Ph: [phone]       Fax: [phone]   RxID:   [id number] TERBINAFINE HCL 250 MG TABS (TERBINAFINE HCL) one by mouth daily for 90 days  #90 x 0   Entered and Authorized by:   Stacie Glaze MD   Signed by:   Stacie Glaze MD on 09/19/2009   Method used:   [facility] [address] [phone]* (retail)       [address]       [place], [place]  [address]       Ph: [phone] or [phone]        Fax: [phone]   RxID:   [id number]

## 2010-05-14 NOTE — Procedures (Signed)
Summary: Colonoscopy  Patient: [name] Note: All result statuses are Final unless otherwise noted.  Tests: (1) Colonoscopy (COL)   COL Colonoscopy           DONE (C)     [facility]     [address]     [place], [place]  [address]           COLONOSCOPY PROCEDURE REPORT     PATIENT:  [name], Shin  MR#:  [id number]     BIRTHDATE:  Sep 14, 1955, 54 yrs. old  GENDER:  male     ENDOSCOPIST:  Judie Petit T. Russella Dar, MD, [facility]           PROCEDURE DATE:  03/04/2010     PROCEDURE:  Colonoscopy [address]     ASA CLASS:  Class II     INDICATIONS:  1) Routine Risk Screening     MEDICATIONS:   Fentanyl 150 mcg IV, Versed 14 mg IV     DESCRIPTION OF PROCEDURE:   After the risks benefits and     alternatives of the procedure were thoroughly explained, informed     consent was obtained.  Digital rectal exam was performed and     revealed no abnormalities.  The LB [id number]-[id number] endoscope     was introduced through the anus and advanced to the cecum, which     was identified by both the appendix and ileocecal valve, limited     by fair prep, a tortuous and redundant colon,  patient discomfort.     The quality of the prep was Moviprep fair.  The instrument was     then slowly withdrawn as the colon was fully examined.     <<PROCEDUREIMAGES>>     FINDINGS:  A normal appearing cecum, ileocecal valve, and     appendiceal orifice were identified. The ascending, hepatic     flexure, transverse, splenic flexure, descending, sigmoid colon,     and rectum appeared unremarkable.   Retroflexed views in the     rectum revealed no abnormalities.  The time to cecum =  24     minutes. The scope was then withdrawn (time =  11  min) from the     patient and the procedure completed.     COMPLICATIONS:  None           ENDOSCOPIC IMPRESSION:     1) Normal colon           RECOMMENDATIONS:     1) Colonoscopy in 5 years for routine CRC screening with a more     extensive prep and MAC sedation             Bunnie Rehberg T. Russella Dar, MD, [facility]           CC: Stacie Glaze, MD           n.     REVISED:  03/04/2010 10:53 AM     eSIGNED:   Judie Petit T. Dulcemaria Bula at 03/04/2010 10:53 AM           Lorenso Quarry, [id number]  Note: An exclamation mark (!) indicates a result that was not dispersed into the flowsheet. Document Creation Date: 03/04/2010 10:53 AM _______________________________________________________________________  (1) Order result status: Final Collection or observation date-time: 03/04/2010 10:47 Requested date-time:  Receipt date-time:  Reported date-time:  Referring Physician:   Ordering Physician: Claudette Head [phone]) Specimen Source:  Source: Launa Grill Order Number: [id number] Lab site:   Appended Document: Colonoscopy  Clinical Lists Changes  Observations: Added new observation of COLONNXTDUE: 02/2015 (03/04/2010 11:54)

## 2010-05-14 NOTE — Progress Notes (Signed)
Summary: dysuria  Phone Note Call from Patient   Caller: Patient Call For: Stacie Glaze MD Reason for Call: Refill Medication Summary of Call: Pt is having dysuria and pain in penis constantly x 24 hours.  Would like to come in. [phone] Initial call taken by: Lynann Beaver CMA,  July 03, 2009 8:38 AM  Follow-up for Phone Call        appointment given for today Follow-up by: Willy Eddy, LPN,  July 03, 2009 8:43 AM

## 2010-05-14 NOTE — Assessment & Plan Note (Signed)
Summary: dysuria/bmw   Vital Signs:  Patient profile:   55 year old male Height:      66 inches Weight:      146 pounds BMI:     23.65 Temp:     98.2 degrees F oral Pulse rate:   641 / minute Resp:     14 per minute BP sitting:   110 / 70  (left arm)  Vitals Entered By: Willy Eddy, LPN (July 03, 2009 3:20 PM) CC: c/o dysuria and burning sensation in penis for 2-3 days- dysuria has been going on longer   CC:  c/o dysuria and burning sensation in penis for 2-3 days- dysuria has been going on longer.  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) 2)  Erectile Dysfunction  (ICD-302.72) 3)  Acute Prostatitis  (ICD-601.0) 4)  Urethritis  (ICD-597.80) 5)  Uns Advrs Eff Uns Rx Medicinal&biological Sbstnc  (ICD-995.20) 6)  Onychomycosis  (ICD-110.1) 7)  Routine General Medical Exam@health  Care Facl  (ICD-V70.0) 8)  Physical Examination  (ICD-V70.0)  Current Problems (verified): 1)  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 Once Daily Prn 2)  Wellbutrin Xl 150 Mg  Tb24 (Bupropion Hcl) .... Once Daily 3)  Prilosec 20 Mg Cpdr (Omeprazole) .Marland Kitchen.. 1 Once Daily As Needed 4)  Clotrimazole-Betamethasone 1-0.05 % Crea (Clotrimazole-Betamethasone) .... Apply To Rash Two Times A Day 5)  Cialis 5 Mg Tabs  (Tadalafil) .... Take As Directed  Current Medications (verified): 1)  Valtrex 500 Mg  Tabs (Valacyclovir Hcl) .... Once Daily Prn 2)  Wellbutrin Xl 150 Mg  Tb24 (Bupropion Hcl) .... Once Daily 3)  Prilosec 20 Mg Cpdr (Omeprazole) .Marland Kitchen.. 1 Once Daily As Needed 4)  Clotrimazole-Betamethasone 1-0.05 % Crea (Clotrimazole-Betamethasone) .... Apply To Rash Two Times A Day 5)  Cialis 5 Mg Tabs (Tadalafil) .... Take As Directed 6)  Lamictal 100 Mg Tabs (Lamotrigine) .Marland Kitchen.. 1 Once Daily 7)  Seroquel 200 Mg Tabs (Quetiapine Fumarate) .Marland Kitchen.. 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  Past Medical History: Reviewed history from 08/03/2007 and no changes required. HSV hx of ETOH abuse  Past Surgical History: Reviewed history from 10/18/2007 and no changes required. Denies surgical history  Family History: Reviewed history from 03/17/2008 and no changes required. n/a  Social History: Reviewed history from 08/03/2007 and no changes required. 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    Physical Exam  General:   Well-developed,well-nourished,in no acute distress; alert,appropriate and cooperative throughout examination Head:  normocephalic and atraumatic.   Eyes:  No corneal or conjunctival inflammation noted. EOMI. Perrla. Funduscopic exam benign, without hemorrhages, exudates or papilledema. Vision grossly normal. Ears:  External ear exam shows no significant lesions or deformities.  Otoscopic examination reveals clear canals, tympanic membranes are intact bilaterally without bulging, retraction, inflammation or discharge. Hearing is grossly normal bilaterally. Neck:  No deformities, masses, or tenderness noted. Lungs:  Normal respiratory effort, chest expands symmetrically. Lungs are clear to auscultation, no crackles or wheezes. Heart:  Normal rate and regular rhythm. S1 and S2 normal without gallop, murmur, click, rub or other extra sounds. Abdomen:  Bowel sounds positive,abdomen soft and non-tender without masses, organomegaly or hernias noted. Genitalia:  circumcised and urethral discharge.   Prostate:  no gland enlargement and no nodules.     Impression & Recommendations:  Problem # 1:  URETHRITIS (ICD-597.80) probable STD wants full testing ofloxin for 7 days moniter tests and cultures  Complete Medication List: 1)  Valtrex 500 Mg Tabs (Valacyclovir hcl) .... Once daily prn 2)  Wellbutrin Xl 150 Mg Tb24 (Bupropion hcl) .... Once daily 3)  Prilosec 20 Mg Cpdr (Omeprazole) .Marland Kitchen.. 1 once daily as needed 4)  Clotrimazole-betamethasone 1-0.05 % Crea (Clotrimazole-betamethasone) .... Apply to rash two times a day 5)  Cialis 5 Mg Tabs (Tadalafil) .... Take as directed 6)  Lamictal 100 Mg Tabs (Lamotrigine) .Marland Kitchen.. 1 once daily 7)  Seroquel 200 Mg Tabs (Quetiapine fumarate) .Marland Kitchen.. 1 once daily 8)  Ofloxacin 400 Mg Tabs (Ofloxacin) .... One by mouth daily for 7 days  Other Orders: Venipuncture ([address]) T-Chlamydia Probe, genital [phone]) T-GC Probe, genital [phone]) T-HIV Antibody   (Reflex) [id number]) T-RPR (Syphilis) [phone])  Patient Instructions: 1)  Take your antibiotic as prescribed until ALL of it is gone, but stop if you develop a rash or swelling and contact our office as soon as possible. Prescriptions: OFLOXACIN 400 MG TABS (OFLOXACIN) one by mouth daily for 7 days  #7 x 0   Entered and Authorized by:   Stacie Glaze MD   Signed by:   Stacie Glaze MD on 07/03/2009   Method used:   [facility]        [facility] [address] [phone]* (retail)       [address]       [place], [place]  [address]       Ph: [phone] or [phone]       Fax: [phone]   RxID:   [id number]

## 2010-05-14 NOTE — Miscellaneous (Signed)
Summary: [facility]  Clinical Lists Changes  Medications: Added new medication of MOVIPREP 100 GM  SOLR (PEG-KCL-NACL-NASULF-NA ASC-C) As per prep instructions. - Signed Rx of MOVIPREP 100 GM  SOLR (PEG-KCL-NACL-NASULF-NA ASC-C) As per prep instructions.;  #1 x 0;  Signed;  Entered by: Ezra Sites RN;  Authorized by: Meryl Dare MD [facility];  Method used: [facility] [address] [phone]*, [address] [place], [place]  [address], Ph: [phone] or [phone], Fax: [phone] Observations: Added new observation of NKA: T (02/18/2010 15:23)    Prescriptions: MOVIPREP 100 GM  SOLR (PEG-KCL-NACL-NASULF-NA ASC-C) As per prep instructions.  #1 x 0   Entered by:   Ezra Sites RN   Authorized by:   Meryl Dare MD [facility]   Signed by:   Ezra Sites RN on 02/18/2010   Method used:   [facility]ronically [facility]        [facility] [address] [phone]* (retail)       [address]       [place], [place]  [address]       Ph: [phone] or [phone]       Fax: [phone]   RxID:   [id number]

## 2010-05-14 NOTE — Letter (Signed)
Summary: Pre Visit Letter Revised  [facility]  [address] [place], [place] [address]   Phone: [phone]  Fax: [phone]        01/02/2010 MRN: [id number] [name] [address] [place], [place]  [address]             Procedure Date:  03-04-10   Welcome to the Gastroenterology Division at [facility].    You are scheduled to see a nurse for your pre-procedure visit on 02-18-10 at 3:30p.m. on the 3rd floor at [facility], [address]  We ask that you try to arrive at our office 15 minutes prior to your appointment time to allow for check-in.  Please take a minute to review the attached form.  If you answer "Yes" to one or more of the questions on the first page, we ask that you call the person listed at your earliest opportunity.  If you answer "No" to all of the questions, please complete the rest of the form and bring it to your appointment.    Your nurse visit will consist of discussing your medical and surgical history, your immediate family medical history, and your medications.   If you are unable to list all of your medications on the form, please bring the medication bottles to your appointment and we will list them.  We will need to be aware of both prescribed and over the counter drugs.  We will need to know exact dosage information as well.    Please be prepared to read and sign documents such as consent forms, a financial agreement, and acknowledgement forms.  If necessary, and with your consent, a friend or relative is welcome to sit-in on the nurse visit with you.  Please bring your insurance card so that we may make a copy of it.  If your insurance requires a referral to see a specialist, please bring your referral form from your primary care physician.  No co-pay is required for this nurse visit.     If you cannot keep your appointment, please call [phone] to cancel or reschedule prior to your appointment date.  This  allows [place] the opportunity to schedule an appointment for another patient in need of care.    Thank you for choosing [facility] for your medical needs.  We appreciate the opportunity to care for you.  Please visit [place] at our website  to learn more about our practice.  Sincerely, The Gastroenterology Division

## 2010-06-12 ENCOUNTER — Other Ambulatory Visit: Payer: Self-pay | Admitting: Internal Medicine

## 2010-06-24 ENCOUNTER — Encounter: Payer: Self-pay | Admitting: Internal Medicine

## 2010-06-24 ENCOUNTER — Ambulatory Visit (INDEPENDENT_AMBULATORY_CARE_PROVIDER_SITE_OTHER): Payer: BC Managed Care – PPO | Admitting: Internal Medicine

## 2010-06-24 VITALS — BP 120/80 | HR 68 | Temp 98.2°F | Resp 14 | Ht 69.0 in | Wt 141.0 lb

## 2010-06-24 DIAGNOSIS — L290 Pruritus ani: Secondary | ICD-10-CM

## 2010-06-24 DIAGNOSIS — F5105 Insomnia due to other mental disorder: Secondary | ICD-10-CM | POA: Insufficient documentation

## 2010-06-24 DIAGNOSIS — F489 Nonpsychotic mental disorder, unspecified: Secondary | ICD-10-CM

## 2010-06-24 DIAGNOSIS — F3160 Bipolar disorder, current episode mixed, unspecified: Secondary | ICD-10-CM

## 2010-06-24 MED ORDER — BUPROPION HCL ER (XL) 150 MG PO TB24: 150.0000 mg | ORAL_TABLET | Freq: Every day | ORAL | Status: DC

## 2010-06-24 MED ORDER — CLONAZEPAM 0.5 MG PO TABS: 0.5000 mg | ORAL_TABLET | Freq: Two times a day (BID) | ORAL | Status: DC | PRN

## 2010-06-24 MED ORDER — LAMOTRIGINE 100 MG PO TABS: 100.0000 mg | ORAL_TABLET | Freq: Every day | ORAL | Status: DC

## 2010-06-24 NOTE — Assessment & Plan Note (Signed)
Erythema and itching No signs of acute infection

## 2010-06-24 NOTE — Assessment & Plan Note (Signed)
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

## 2010-07-08 ENCOUNTER — Telehealth: Payer: Self-pay | Admitting: *Deleted

## 2010-07-08 MED ORDER — CIPROFLOXACIN HCL 500 MG PO TABS: 500.0000 mg | ORAL_TABLET | Freq: Two times a day (BID) | ORAL | Status: AC

## 2010-07-08 NOTE — Telephone Encounter (Signed)
Per dr jenkins0 may have cipro 500 bid for 21 days

## 2010-07-08 NOTE — Telephone Encounter (Signed)
Pt is havng the same symptoms as his usual prostatitis, and would like to be treated.  [facility], dysuria, frequencey and low abd pain.

## 2010-07-08 NOTE — Telephone Encounter (Deleted)
Pt has swollen and painful ankle that is bluish looking.  Feels a clot.  Also has lost 30 lbs recently. Hx of fractured ankle years ago. Please call at [phone] [facility] in [facility].

## 2010-07-09 ENCOUNTER — Telehealth: Payer: Self-pay | Admitting: Internal Medicine

## 2010-07-09 NOTE — Telephone Encounter (Signed)
Pt called and  Has ov sch for 07/29/10, but has a conflict that day and needs to either get in sooner, or get in before 08/09/10. Pls advise.

## 2010-07-09 NOTE — Telephone Encounter (Signed)
You can use one of the opening on April 2

## 2010-07-11 NOTE — Telephone Encounter (Signed)
I called and lft vm for pt to cb and rsc for 07/15/10. Waiting on call back.

## 2010-07-11 NOTE — Telephone Encounter (Signed)
Left message on machine Check with pharmacy--

## 2010-07-11 NOTE — Telephone Encounter (Signed)
Called pt re: rsc to 07/15/10, but pt said that he can not do that day either. Pt says that everything is fine right now and if anything changes, he will let Dr Lovell Sheehan know. Also pt says that he had called Monday 07/08/10 with symptoms of Prostratitius and left info with Triage nurse. Has not rcvd call back. Symptoms are much better, but normally gets antibiotics.

## 2010-07-29 ENCOUNTER — Ambulatory Visit: Payer: BC Managed Care – PPO | Admitting: Internal Medicine

## 2010-07-29 ENCOUNTER — Other Ambulatory Visit: Payer: Self-pay | Admitting: Internal Medicine

## 2010-08-07 ENCOUNTER — Other Ambulatory Visit: Payer: Self-pay | Admitting: Internal Medicine

## 2010-09-11 ENCOUNTER — Other Ambulatory Visit: Payer: Self-pay | Admitting: *Deleted

## 2010-09-11 MED ORDER — VALACYCLOVIR HCL 500 MG PO TABS: 500.0000 mg | ORAL_TABLET | Freq: Every day | ORAL | Status: DC

## 2010-12-13 ENCOUNTER — Other Ambulatory Visit: Payer: Self-pay | Admitting: *Deleted

## 2010-12-13 DIAGNOSIS — F489 Nonpsychotic mental disorder, unspecified: Secondary | ICD-10-CM

## 2010-12-13 MED ORDER — CLONAZEPAM 0.5 MG PO TABS: 0.5000 mg | ORAL_TABLET | Freq: Two times a day (BID) | ORAL | Status: DC | PRN

## 2011-01-13 ENCOUNTER — Other Ambulatory Visit: Payer: Self-pay | Admitting: Internal Medicine

## 2011-01-21 ENCOUNTER — Other Ambulatory Visit: Payer: Self-pay | Admitting: Internal Medicine

## 2011-02-03 ENCOUNTER — Telehealth: Payer: Self-pay | Admitting: Internal Medicine

## 2011-02-03 NOTE — Telephone Encounter (Addendum)
Pt would like to increase generic wellbutrin 150mg  xl to 300mg  once a day. Pt pharm [facility] [phone] .

## 2011-02-05 ENCOUNTER — Ambulatory Visit: Payer: BC Managed Care – PPO

## 2011-02-06 ENCOUNTER — Other Ambulatory Visit: Payer: Self-pay | Admitting: *Deleted

## 2011-02-06 MED ORDER — BUPROPION HCL ER (XL) 300 MG PO TB24: 300.0000 mg | ORAL_TABLET | ORAL | Status: DC

## 2011-02-06 NOTE — Telephone Encounter (Signed)
Ok to increase to 300 per dr Lovell Sheehan- Left message on machine For pt and med sent in

## 2011-02-14 ENCOUNTER — Other Ambulatory Visit: Payer: Self-pay | Admitting: Internal Medicine

## 2011-02-24 ENCOUNTER — Ambulatory Visit (INDEPENDENT_AMBULATORY_CARE_PROVIDER_SITE_OTHER): Payer: BC Managed Care – PPO | Admitting: Internal Medicine

## 2011-02-24 VITALS — BP 120/70 | HR 56 | Temp 98.1°F | Resp 14 | Ht 69.0 in | Wt 143.0 lb

## 2011-02-24 DIAGNOSIS — F319 Bipolar disorder, unspecified: Secondary | ICD-10-CM

## 2011-02-24 DIAGNOSIS — F489 Nonpsychotic mental disorder, unspecified: Secondary | ICD-10-CM

## 2011-02-24 DIAGNOSIS — F5105 Insomnia due to other mental disorder: Secondary | ICD-10-CM

## 2011-02-24 DIAGNOSIS — N529 Male erectile dysfunction, unspecified: Secondary | ICD-10-CM

## 2011-02-24 DIAGNOSIS — Z23 Encounter for immunization: Secondary | ICD-10-CM

## 2011-02-24 MED ORDER — LAMOTRIGINE 100 MG PO TABS: 100.0000 mg | ORAL_TABLET | Freq: Every day | ORAL | Status: DC

## 2011-02-24 MED ORDER — CLONAZEPAM 1 MG PO TABS: 1.0000 mg | ORAL_TABLET | Freq: Two times a day (BID) | ORAL | Status: DC | PRN

## 2011-02-24 NOTE — Progress Notes (Signed)
  Subjective:    Patient ID: [name], male    DOB: October 04, 1955, 55 y.o.   MRN: [id number]  HPI  Mood up and down every 6 weeks there is a "low spot" Just increased the Wellbutrin to 300.... He recently resumed Lamictal. He has a history of pressure in with some features of bipolar depression His weight has been stable He is on prophylaxis for herpes Review of Systems  Constitutional: Negative for fever and fatigue.  HENT: Negative for hearing loss, congestion, neck pain and postnasal drip.   Eyes: Negative for discharge, redness and visual disturbance.  Respiratory: Negative for cough, shortness of breath and wheezing.   Cardiovascular: Negative for leg swelling.  Gastrointestinal: Negative for abdominal pain, constipation and abdominal distention.  Genitourinary: Negative for urgency and frequency.  Musculoskeletal: Negative for joint swelling and arthralgias.  Skin: Negative for color change and rash.  Neurological: Negative for weakness and light-headedness.  Hematological: Negative for adenopathy.  Psychiatric/Behavioral: Negative for behavioral problems.   Past Medical History  Diagnosis Date  . HSV (herpes simplex virus) infection   . Alcohol abuse, in remission    No past surgical history on file.  reports that he quit smoking about 10 years ago. He does not have any smokeless tobacco history on file. He reports that he drinks alcohol. He reports that he does not use illicit drugs. family history includes Colitis in his mother and Heart disease in his father and mother. No Known Allergies     Objective:   Physical Exam  Nursing note and vitals reviewed. Constitutional: He appears well-developed and well-nourished.  HENT:  Head: Normocephalic and atraumatic.  Eyes: Conjunctivae are normal. Pupils are equal, round, and reactive to light.  Neck: Normal range of motion. Neck supple.  Cardiovascular: Normal rate and regular rhythm.   Pulmonary/Chest: Effort normal  and breath sounds normal.  Abdominal: Soft. Bowel sounds are normal.          Assessment & Plan:  First activity to increase the Wellbutrin to 300 mg which the patient is done so call in a prescription for 300 mg tablets then if after 2-3 weeks he still has the cycles of depression would increase the Lamictal to 200 mg Continue his prophylaxis for herpes His weight is stable His insomnia stable Subsequently patient for rectal dysfunction the

## 2011-02-24 NOTE — Patient Instructions (Addendum)
The patient is instructed to continue all medications as prescribed. Schedule followup with check out clerk upon leaving the clinic If after 2 weeks the increased dose of Wellbutrin does not help with the variation in mood call and we will increase the Lamictal to 200 mg

## 2011-02-26 ENCOUNTER — Encounter: Payer: Self-pay | Admitting: Internal Medicine

## 2011-03-13 ENCOUNTER — Other Ambulatory Visit: Payer: Self-pay | Admitting: Internal Medicine

## 2011-04-13 ENCOUNTER — Other Ambulatory Visit: Payer: Self-pay | Admitting: Internal Medicine

## 2011-05-02 ENCOUNTER — Other Ambulatory Visit (INDEPENDENT_AMBULATORY_CARE_PROVIDER_SITE_OTHER): Payer: BC Managed Care – PPO

## 2011-05-02 DIAGNOSIS — Z0000 Encounter for general adult medical examination without abnormal findings: Secondary | ICD-10-CM

## 2011-05-02 LAB — HEPATIC FUNCTION PANEL
Bilirubin, Direct: 0.2 mg/dL (ref 0.0–0.3)
Total Bilirubin: 1.2 mg/dL (ref 0.3–1.2)

## 2011-05-02 LAB — CBC WITH DIFFERENTIAL/PLATELET
Basophils Absolute: 0 10*3/uL (ref 0.0–0.1)
HCT: 43 % (ref 39.0–52.0)
Lymphs Abs: 2 10*3/uL (ref 0.7–4.0)
MCV: 96.2 fl (ref 78.0–100.0)
Monocytes Absolute: 0.5 10*3/uL (ref 0.1–1.0)
Monocytes Relative: 9.8 % (ref 3.0–12.0)
Neutrophils Relative %: 42.4 % — ABNORMAL LOW (ref 43.0–77.0)
Platelets: 146 10*3/uL — ABNORMAL LOW (ref 150.0–400.0)
RDW: 12.5 % (ref 11.5–14.6)
WBC: 4.7 10*3/uL (ref 4.5–10.5)

## 2011-05-02 LAB — BASIC METABOLIC PANEL
BUN: 13 mg/dL (ref 6–23)
Chloride: 99 mEq/L (ref 96–112)
GFR: 102.64 mL/min (ref >60.00)
Glucose, Bld: 78 mg/dL (ref 70–99)
Potassium: 4.2 mEq/L (ref 3.5–5.1)
Sodium: 139 mEq/L (ref 135–145)

## 2011-05-02 LAB — LIPID PANEL
Cholesterol: 184 mg/dL (ref 0–200)
HDL: 72.2 mg/dL (ref >39.00)
LDL Cholesterol: 103 mg/dL — ABNORMAL HIGH (ref 0–99)
VLDL: 8.6 mg/dL (ref 0.0–40.0)

## 2011-05-02 LAB — POCT URINALYSIS DIPSTICK
Bilirubin, UA: NEGATIVE
Blood, UA: NEGATIVE
Ketones, UA: NEGATIVE
Leukocytes, UA: NEGATIVE
Spec Grav, UA: 1.02
pH, UA: 8.5

## 2011-05-02 LAB — TSH: TSH: 0.92 u[IU]/mL (ref 0.35–5.50)

## 2011-05-09 ENCOUNTER — Ambulatory Visit (INDEPENDENT_AMBULATORY_CARE_PROVIDER_SITE_OTHER): Payer: BC Managed Care – PPO | Admitting: Internal Medicine

## 2011-05-09 ENCOUNTER — Encounter: Payer: Self-pay | Admitting: Internal Medicine

## 2011-05-09 VITALS — BP 110/70 | HR 72 | Temp 98.0°F | Resp 14 | Ht 69.0 in | Wt 149.0 lb

## 2011-05-09 DIAGNOSIS — Z0000 Encounter for general adult medical examination without abnormal findings: Secondary | ICD-10-CM

## 2011-05-09 NOTE — Progress Notes (Signed)
Subjective:    Patient ID: [name], male    DOB: 13-Mar-1956, 56 y.o.   MRN: [id number]  HPI cpx Generally he is doing well his only acute complaint today is erectile dysfunction has persisted as before ED medications He is compliant with all medications    Review of Systems  Constitutional: Negative for fever and fatigue.  HENT: Negative for hearing loss, congestion, neck pain and postnasal drip.   Eyes: Negative for discharge, redness and visual disturbance.  Respiratory: Negative for cough, shortness of breath and wheezing.   Cardiovascular: Negative for leg swelling.  Gastrointestinal: Negative for abdominal pain, constipation and abdominal distention.  Genitourinary: Negative for urgency and frequency.  Musculoskeletal: Negative for joint swelling and arthralgias.  Skin: Negative for color change and rash.  Neurological: Negative for weakness and light-headedness.  Hematological: Negative for adenopathy.  Psychiatric/Behavioral: Negative for behavioral problems.   Past Medical History  Diagnosis Date  . HSV (herpes simplex virus) infection   . Alcohol abuse, in remission     History   Social History  . Marital Status: Divorced    Spouse Name: N/A    Number of Children: N/A  . Years of Education: N/A   Occupational History  . professor [employer]   Social History Main Topics  . Smoking status: Former Smoker    Quit date: 06/23/2000  . Smokeless tobacco: Not on file   Comment: quit in 2000  . Alcohol Use: Yes  . Drug Use: No  . Sexually Active: Yes   Other Topics Concern  . Not on file   Social History Narrative  . No narrative on file    No past surgical history on file.  Family History  Problem Relation Age of Onset  . Heart disease Mother   . Colitis Mother   . Heart disease Father     cabg    No Known Allergies  Current Outpatient Prescriptions on File Prior to Visit  Medication Sig Dispense Refill  . buPROPion (WELLBUTRIN XL) 300 MG 24  hr tablet Take 1 tablet (300 mg total) by mouth every morning.  30 tablet  6  . CIALIS 5 MG tablet TAKE AS DIRECTED  30 tablet  2  . clonazePAM (KLONOPIN) 1 MG tablet Take 1 tablet (1 mg total) by mouth 2 (two) times daily as needed for anxiety.  60 tablet  5  . clotrimazole-betamethasone (LOTRISONE) cream Apply topically 2 (two) times daily.        Marland Kitchen lamoTRIgine (LAMICTAL) 100 MG tablet Take 1 tablet (100 mg total) by mouth daily.  30 tablet  2  . omeprazole (PRILOSEC) 20 MG capsule Take 20 mg by mouth daily.        . valACYclovir (VALTREX) 500 MG tablet TAKE 1 TABLET BY MOUTH EVERY DAY  30 tablet  0    BP 110/70  Pulse 72  Temp 98 F (36.7 C)  Resp 14  Ht 5\' 9"  (1.753 m)  Wt 149 lb (67.586 kg)  BMI 22.00 kg/m2       Objective:   Physical Exam  Nursing note and vitals reviewed. Constitutional: He is oriented to person, place, and time. He appears well-developed and well-nourished.  HENT:  Head: Normocephalic and atraumatic.  Eyes: Conjunctivae are normal. Pupils are equal, round, and reactive to light.  Neck: Normal range of motion. Neck supple.  Cardiovascular: Normal rate and regular rhythm.   Pulmonary/Chest: Effort normal and breath sounds normal.  Abdominal: Soft. Bowel sounds are normal.  Musculoskeletal: Normal range of motion.  Neurological: He is alert and oriented to person, place, and time.  Skin: Skin is warm and dry.  Psychiatric: He has a normal mood and affect. His behavior is normal.          Assessment & Plan:   Patient presents for yearly preventative medicine examination.   all immunizations and health maintenance protocols were reviewed with the patient and they are up to date with these protocols.   screening laboratory values were reviewed with the patient including screening of hyperlipidemia PSA renal function and hepatic function.   There medications past medical history social history problem list and allergies were reviewed in detail.    Goals were established with regard to weight loss exercise diet in compliance with medications

## 2011-05-14 ENCOUNTER — Other Ambulatory Visit: Payer: Self-pay | Admitting: Internal Medicine

## 2011-05-19 ENCOUNTER — Other Ambulatory Visit: Payer: Self-pay | Admitting: Internal Medicine

## 2011-06-17 ENCOUNTER — Other Ambulatory Visit: Payer: Self-pay | Admitting: Internal Medicine

## 2011-07-08 ENCOUNTER — Other Ambulatory Visit: Payer: Self-pay | Admitting: Internal Medicine

## 2011-07-22 ENCOUNTER — Other Ambulatory Visit: Payer: Self-pay | Admitting: Internal Medicine

## 2011-08-25 ENCOUNTER — Telehealth: Payer: Self-pay | Admitting: Family Medicine

## 2011-08-25 DIAGNOSIS — F489 Nonpsychotic mental disorder, unspecified: Secondary | ICD-10-CM

## 2011-08-25 MED ORDER — LAMOTRIGINE 100 MG PO TABS: 100.0000 mg | ORAL_TABLET | Freq: Every day | ORAL | Status: DC

## 2011-08-25 MED ORDER — CLONAZEPAM 1 MG PO TABS: 1.0000 mg | ORAL_TABLET | Freq: Two times a day (BID) | ORAL | Status: DC | PRN

## 2011-08-25 MED ORDER — BUPROPION HCL ER (XL) 300 MG PO TB24: 300.0000 mg | ORAL_TABLET | ORAL | Status: DC

## 2011-08-25 NOTE — Telephone Encounter (Signed)
Refilled to tx

## 2011-08-25 NOTE — Telephone Encounter (Signed)
Pt called at 1:57 - pulled from Triage vmail. He needs a refill, but he did not specify which med. However, he is in [place], [place] til January, and [facility] [place]. He just wants to make sure his med is sent [facility] [place].

## 2011-09-01 ENCOUNTER — Other Ambulatory Visit: Payer: Self-pay | Admitting: Internal Medicine

## 2011-09-26 ENCOUNTER — Other Ambulatory Visit: Payer: Self-pay | Admitting: *Deleted

## 2011-09-26 MED ORDER — TADALAFIL 5 MG PO TABS
5.0000 mg | ORAL_TABLET | Freq: Every day | ORAL | Status: DC | PRN
Start: 2011-09-26 — End: 2012-08-05

## 2012-01-08 ENCOUNTER — Other Ambulatory Visit: Payer: Self-pay | Admitting: Internal Medicine

## 2012-01-13 ENCOUNTER — Other Ambulatory Visit: Payer: Self-pay | Admitting: *Deleted

## 2012-01-28 ENCOUNTER — Other Ambulatory Visit: Payer: Self-pay | Admitting: Internal Medicine

## 2012-03-13 ENCOUNTER — Other Ambulatory Visit: Payer: Self-pay | Admitting: Internal Medicine

## 2012-04-01 ENCOUNTER — Other Ambulatory Visit: Payer: Self-pay | Admitting: Internal Medicine

## 2012-05-22 ENCOUNTER — Other Ambulatory Visit: Payer: Self-pay | Admitting: Internal Medicine

## 2012-06-07 ENCOUNTER — Other Ambulatory Visit: Payer: Self-pay | Admitting: *Deleted

## 2012-06-07 MED ORDER — BUPROPION HCL ER (XL) 300 MG PO TB24: 300.0000 mg | ORAL_TABLET | ORAL | Status: DC

## 2012-06-09 ENCOUNTER — Other Ambulatory Visit: Payer: Self-pay | Admitting: Internal Medicine

## 2012-07-30 ENCOUNTER — Other Ambulatory Visit: Payer: Self-pay | Admitting: Internal Medicine

## 2012-08-02 ENCOUNTER — Telehealth: Payer: Self-pay | Admitting: Internal Medicine

## 2012-08-02 NOTE — Telephone Encounter (Signed)
Left message on machine Per dr Lovell Sheehan- must check lft prior to starting adn while taking lamisil-- per dr Williemae Area cup of white vinegar and warm water 3 t imes a week for 30 mintues

## 2012-08-02 NOTE — Telephone Encounter (Signed)
Patient Information:  Caller Name: Brennan  Phone: [phone]  Patient: [name], [name]  Gender: Male  DOB: [birth year]  Age: 57 Years  PCP: Darryll Capers (Adults only)  Office Follow Up:  Does the office need to follow up with this patient?: Yes  Instructions For The Office: Request a Rx for [facility] Rx.  Unable to schedule physical appt. 5months out. Will not be in [place] for the summer months.  RN Note:  [facility].   Please contact patient,  He has been unable to schedule an appt for physicial- booked 5 months out . He is not in [place] during the summer months and must wait till he gets back.  Symptoms  Reason For Call & Symptoms: Patient is asking for a refill of medication -  Fungus for toenails-Lamasil it written several years ago for patient.  He states reoccurance of infection 2 months ago , Left foot , 4 toes.   LOV 04/2011  Reviewed Health History In EMR: Yes  Reviewed Medications In EMR: Yes  Reviewed Allergies In EMR: Yes  Reviewed Surgeries / Procedures: Yes  Date of Onset of Symptoms: 05/31/2012  Guideline(s) Used:  Rash or Redness - Localized  Athlete's Foot  Disposition Per Guideline:   Home Care  Reason For Disposition Reached:   Athlete's foot with no complications  Advice Given:  Antifungal Cream:  Available over-the-counter in [place] as terbinafine (Lamisil AT), clotrimazole (Lotrimin AF), or miconazole (Micatin, Monistat-Derm).  Keep the Feet Clean and Dry:   Wash the feet 2 times every day. Dry the feet completely, especially between the toes. Then apply the cream. Wear clean socks and change them twice daily.  Avoid Scratching:   Scratching infected feet will delay healing. Rinse the itchy feet in cool water for relief.  Contagiousness:  The socks can be washed with regular laundry. They don't need to be boiled.  Call Back If:  Rash looks infected (e.g., spreading redness, streaks, pus)  You become  worse  RN Overrode Recommendation:  Patient Requests Prescription  Request a Rx for Lamasil Rx.  Unable to schedule physical appt. 5months out. Will not be in [place] for the summer months.

## 2012-08-05 ENCOUNTER — Other Ambulatory Visit: Payer: Self-pay | Admitting: Internal Medicine

## 2012-09-13 ENCOUNTER — Other Ambulatory Visit: Payer: Self-pay | Admitting: *Deleted

## 2012-09-13 MED ORDER — VALACYCLOVIR HCL 500 MG PO TABS: ORAL_TABLET | ORAL | Status: DC

## 2012-10-16 ENCOUNTER — Observation Stay (HOSPITAL_COMMUNITY)
Admission: EM | Admit: 2012-10-16 | Discharge: 2012-10-17 | Disposition: A | Discharge status: Home / Self Care | Payer: BC Managed Care – PPO | Attending: Internal Medicine | Admitting: Internal Medicine

## 2012-10-16 ENCOUNTER — Emergency Department (HOSPITAL_COMMUNITY): Payer: BC Managed Care – PPO

## 2012-10-16 ENCOUNTER — Encounter (HOSPITAL_COMMUNITY): Payer: Self-pay

## 2012-10-16 DIAGNOSIS — R079 Chest pain, unspecified: Secondary | ICD-10-CM

## 2012-10-16 DIAGNOSIS — E871 Hypo-osmolality and hyponatremia: Secondary | ICD-10-CM | POA: Insufficient documentation

## 2012-10-16 DIAGNOSIS — R030 Elevated blood-pressure reading, without diagnosis of hypertension: Secondary | ICD-10-CM | POA: Insufficient documentation

## 2012-10-16 DIAGNOSIS — K219 Gastro-esophageal reflux disease without esophagitis: Secondary | ICD-10-CM | POA: Insufficient documentation

## 2012-10-16 DIAGNOSIS — F3160 Bipolar disorder, current episode mixed, unspecified: Secondary | ICD-10-CM | POA: Insufficient documentation

## 2012-10-16 DIAGNOSIS — Z79899 Other long term (current) drug therapy: Secondary | ICD-10-CM | POA: Insufficient documentation

## 2012-10-16 LAB — TROPONIN I
Troponin I: <0.3 ng/mL (ref <0.30)
Troponin I: <0.3 ng/mL (ref <0.30)

## 2012-10-16 LAB — POCT I-STAT TROPONIN I

## 2012-10-16 LAB — CBC
HCT: 44.6 % (ref 39.0–52.0)
Hemoglobin: 15.1 g/dL (ref 13.0–17.0)
Platelets: 180 10*3/uL (ref 150–400)
RBC: 4.89 MIL/uL (ref 4.22–5.81)
WBC: 7.1 10*3/uL (ref 4.0–10.5)

## 2012-10-16 LAB — BASIC METABOLIC PANEL
Calcium: 9.1 mg/dL (ref 8.4–10.5)
Creatinine, Ser: 0.76 mg/dL (ref 0.50–1.35)
GFR calc Af Amer: >90 mL/min (ref >90)

## 2012-10-16 LAB — PROTIME-INR: Prothrombin Time: 12.3 seconds (ref 11.6–15.2)

## 2012-10-16 MED ORDER — ONDANSETRON HCL 4 MG/2ML IJ SOLN: 4.0000 mg | Freq: Four times a day (QID) | INTRAMUSCULAR | Status: DC | PRN

## 2012-10-16 MED ORDER — ENOXAPARIN SODIUM 40 MG/0.4ML ~~LOC~~ SOLN
40.0000 mg | SUBCUTANEOUS | Status: DC
  Administered 2012-10-16: 40 mg via SUBCUTANEOUS
  Filled 2012-10-16 (×3): qty 0.4

## 2012-10-16 MED ORDER — SODIUM CHLORIDE 0.9 % IV SOLN
INTRAVENOUS | Status: AC
  Administered 2012-10-16: 21:00:00 via INTRAVENOUS

## 2012-10-16 MED ORDER — SODIUM CHLORIDE 0.9 % IJ SOLN
3.0000 mL | Freq: Two times a day (BID) | INTRAMUSCULAR | Status: DC
  Administered 2012-10-16: 3 mL via INTRAVENOUS

## 2012-10-16 MED ORDER — ACETAMINOPHEN 650 MG RE SUPP: 650.0000 mg | Freq: Four times a day (QID) | RECTAL | Status: DC | PRN

## 2012-10-16 MED ORDER — ASPIRIN 325 MG PO TABS
325.0000 mg | ORAL_TABLET | ORAL | Status: AC
  Administered 2012-10-16: 325 mg via ORAL
  Filled 2012-10-16: qty 1

## 2012-10-16 MED ORDER — NITROGLYCERIN 0.4 MG SL SUBL: 0.4000 mg | SUBLINGUAL_TABLET | SUBLINGUAL | Status: DC | PRN

## 2012-10-16 MED ORDER — LAMOTRIGINE 100 MG PO TABS
100.0000 mg | ORAL_TABLET | Freq: Every day | ORAL | Status: DC
  Administered 2012-10-17: 100 mg via ORAL
  Filled 2012-10-16: qty 1

## 2012-10-16 MED ORDER — VALACYCLOVIR HCL 500 MG PO TABS: 500.0000 mg | ORAL_TABLET | Freq: Every day | ORAL | Status: DC

## 2012-10-16 MED ORDER — ACETAMINOPHEN 325 MG PO TABS: 650.0000 mg | ORAL_TABLET | Freq: Four times a day (QID) | ORAL | Status: DC | PRN

## 2012-10-16 MED ORDER — HYDRALAZINE HCL 20 MG/ML IJ SOLN: 10.0000 mg | Freq: Four times a day (QID) | INTRAMUSCULAR | Status: DC | PRN

## 2012-10-16 MED ORDER — ASPIRIN EC 81 MG PO TBEC
81.0000 mg | DELAYED_RELEASE_TABLET | Freq: Every day | ORAL | Status: DC
  Administered 2012-10-17: 81 mg via ORAL
  Filled 2012-10-16: qty 1

## 2012-10-16 MED ORDER — BUPROPION HCL ER (XL) 300 MG PO TB24
300.0000 mg | ORAL_TABLET | Freq: Every day | ORAL | Status: DC
  Administered 2012-10-17: 300 mg via ORAL
  Filled 2012-10-16: qty 1

## 2012-10-16 MED ORDER — OLANZAPINE 2.5 MG PO TABS
2.5000 mg | ORAL_TABLET | Freq: Every day | ORAL | Status: DC
  Administered 2012-10-16: 2.5 mg via ORAL
  Filled 2012-10-16 (×2): qty 1

## 2012-10-16 MED ORDER — ONDANSETRON HCL 4 MG PO TABS: 4.0000 mg | ORAL_TABLET | Freq: Four times a day (QID) | ORAL | Status: DC | PRN

## 2012-10-16 MED ORDER — CLONAZEPAM 0.5 MG PO TABS
0.5000 mg | ORAL_TABLET | Freq: Four times a day (QID) | ORAL | Status: DC | PRN
  Administered 2012-10-16: 0.5 mg via ORAL
  Filled 2012-10-16: qty 1

## 2012-10-16 MED ORDER — PANTOPRAZOLE SODIUM 40 MG PO TBEC
40.0000 mg | DELAYED_RELEASE_TABLET | Freq: Every day | ORAL | Status: DC
  Administered 2012-10-17: 40 mg via ORAL
  Filled 2012-10-16: qty 1

## 2012-10-16 NOTE — ED Notes (Signed)
PA at bedside.

## 2012-10-16 NOTE — ED Notes (Signed)
Pt c/o L side chest pain and L arm numbness x ago.  Pain score 6/10.  Sts pain increases with deep breathing.  Pt sts "numbness has gotten better, but it is now tingling."  Denies cardiac Hx.  A & Ox4.

## 2012-10-16 NOTE — ED Provider Notes (Signed)
History    CSN: [id number] Arrival date & time 10/16/12  4098  First MD Initiated Contact with Patient 10/16/12 1000     Chief Complaint  Patient presents with  . Chest Pain   (Consider location/radiation/quality/duration/timing/severity/associated sxs/prior Treatment) HPI [name] is a 57 y.o. male who presents to ED this morning with complaint of left sided chest pain. States he feels short of breath. States pain began this morning while he was making coffee. Pain is constant but is worsened with deep inspirations. Pt denies prior chest pain or cardiac history. Negative stress test 9 months ago. Pt states he is a daily runner, runs 40+mi/week, denies any chest pain when he runs. States he did take a flight from [place] 2 days ago. Pt denies swelling or pain in his legs.   Past Medical History  Diagnosis Date  . HSV (herpes simplex virus) infection   . Alcohol abuse, in remission    History reviewed. No pertinent past surgical history. Family History  Problem Relation Age of Onset  . Heart disease Mother   . Colitis Mother   . Heart disease Father     cabg   History  Substance Use Topics  . Smoking status: Former Smoker    Quit date: 06/23/2000  . Smokeless tobacco: Not on file     Comment: quit in 2000  . Alcohol Use: Yes    Review of Systems  Constitutional: Negative for fever and chills.  Respiratory: Positive for chest tightness and shortness of breath. Negative for cough, wheezing and stridor.   Cardiovascular: Positive for chest pain. Negative for palpitations and leg swelling.  Gastrointestinal: Negative.   All other systems reviewed and are negative.    Allergies  Review of patient's allergies indicates no known allergies.  Home Medications   Current Outpatient Rx  Name  Route  Sig  Dispense  Refill  . buPROPion (WELLBUTRIN XL) 300 MG 24 hr tablet   Oral   Take 1 tablet (300 mg total) by mouth every morning.   90 tablet   3   . CIALIS 5 MG  tablet      TAKE 1 TABLET EVERY DAY AS NEEDED FOR ERECTILE DYSFUNCTION   10 tablet   0   . clonazePAM (KLONOPIN) 1 MG tablet      TAKE 1 TABLET BY MOUTH TWICE A DAY AS NEEDED   60 tablet   3   . clotrimazole-betamethasone (LOTRISONE) cream   Topical   Apply topically 2 (two) times daily.           Marland Kitchen lamoTRIgine (LAMICTAL) 100 MG tablet      TAKE 2 TABLETS EVERY DAY   60 tablet   0   . omeprazole (PRILOSEC) 20 MG capsule   Oral   Take 20 mg by mouth daily.           . valACYclovir (VALTREX) 500 MG tablet      TAKE 1 TABLET BY MOUTH EVERY DAY   30 tablet   0   . valACYclovir (VALTREX) 500 MG tablet      TAKE 1 TABLET BY MOUTH EVERY DAY   30 tablet   3    BP 174/93  Pulse 60  Temp(Src) 97.6 F (36.4 C) (Oral)  Resp 18  Ht 5' 9.5" (1.765 m)  Wt 149 lb (67.586 kg)  BMI 21.7 kg/m2  SpO2 100% Physical Exam  Nursing note and vitals reviewed. Constitutional: He is oriented to person, place, and  time. He appears well-developed and well-nourished. No distress.  Eyes: Conjunctivae are normal.  Neck: Neck supple.  Cardiovascular: Normal rate, regular rhythm and normal heart sounds.   Pulmonary/Chest: Effort normal and breath sounds normal. No respiratory distress. He has no wheezes. He has no rales.  Musculoskeletal: He exhibits no edema.  No LE swelling, dorsal pedal pulses intact bilaterally. No calve tenderness, negative homan's sign  Neurological: He is alert and oriented to person, place, and time.  Skin: Skin is warm and dry.    ED Course  Procedures (including critical care time)  Date: 10/16/2012  Rate: 58  Rhythm: sinus bradycardia  QRS Axis: normal  Intervals: normal  ST/T Wave abnormalities: normal  Conduction Disutrbances: none  Narrative Interpretation:   Old EKG Reviewed: none available   Results for orders placed during the hospital encounter of 10/16/12  CBC      Result Value Range   WBC 7.1  4.0 - 10.5 K/uL   RBC 4.89  4.22 - 5.81  MIL/uL   Hemoglobin 15.1  13.0 - 17.0 g/dL   HCT 11.9  14.7 - 82.9 %   MCV 91.2  78.0 - 100.0 fL   MCH 30.9  26.0 - 34.0 pg   MCHC 33.9  30.0 - 36.0 g/dL   RDW 56.2  13.0 - 86.5 %   Platelets 180  150 - 400 K/uL  BASIC METABOLIC PANEL      Result Value Range   Sodium 133 (*) 135 - 145 mEq/L   Potassium 3.9  3.5 - 5.1 mEq/L   Chloride 98  96 - 112 mEq/L   CO2 29  19 - 32 mEq/L   Glucose, Bld 103 (*) 70 - 99 mg/dL   BUN 10  6 - 23 mg/dL   Creatinine, Ser 7.84  0.50 - 1.35 mg/dL   Calcium 9.1  8.4 - 69.6 mg/dL   GFR calc non Af Amer >90  >90 mL/min   GFR calc Af Amer >90  >90 mL/min  PROTIME-INR      Result Value Range   Prothrombin Time 12.3  11.6 - 15.2 seconds   INR 0.93  0.00 - 1.49  D-DIMER, QUANTITATIVE      Result Value Range   D-Dimer, Quant <0.27  0.00 - 0.48 ug/mL-FEU  POCT I-STAT TROPONIN I      Result Value Range   Troponin i, poc 0.01  0.00 - 0.08 ng/mL   Comment 3           POCT I-STAT TROPONIN I      Result Value Range   Troponin i, poc 0.10 (*) 0.00 - 0.08 ng/mL   Comment NOTIFIED PHYSICIAN     Comment 3            Dg Chest 2 View  10/16/2012   *RADIOLOGY REPORT*  Clinical Data: Chest pain  CHEST - 2 VIEW  Comparison: None  Findings: The heart size and mediastinal contours are within normal limits.  Both lungs are clear.  The visualized skeletal structures are remarkable for mild multilevel thoracic spondylosis.  IMPRESSION: Negative exam.   Original Report Authenticated By: Signa Kell, M.D.    3:58 PM Pt's 2nd troponin which was obtained at 6hr from chest pain onset is elevated at 0.1. Pt states pain is improved, down to 3/10. Will try SL nitro. Pt already received asa. Will consult internal med for obs.   Spoke with medicine, asked for real troponin before will admit.  1. Chest pain     MDM  Pt with atypical pleuritic chest pain. Initial presentation, pt is hypertensive, otherwise normal VS. Pt was complaining of SOB with pain. Recent travel from  [place]. D dimer obtained and is negative first set of troponin negative. Discussed with Dr. Fonnie Jarvis, monitored pt to recheck troponin at 6 hrs after pain onset.  POC Troponin at 6 hrs is positive. Spoke with [facility]ad. Will see pt in ED.   Filed Vitals:   10/16/12 0945 10/16/12 1459  BP: 174/93 164/89  Pulse: 60 52  Temp: 97.6 F (36.4 C)   TempSrc: Oral   Resp: 18 15  Height: 5' 9.5" (1.765 m)   Weight: 149 lb (67.586 kg)   SpO2: 100% 99%     Myriam Jacobson Willia Lampert, PA-C 10/16/12 1712

## 2012-10-16 NOTE — Consult Note (Signed)
Reason for Consult: Chest pain  Primary Care Physician:  Dr. Darryll Capers  Referring Physician: Dr. Rito Ehrlich, [name] is an 57 y.o. male.   HPI: 57y/o male currently seen in consultation with Dr. Rito Ehrlich for evaluation of chest pain.  He was in his usual state of health until the morning of presentation when he developed chest pain while making coffee. His chest pain is described as pressure, 5/10 in severity, last 1 hour and associated with shortness of breath and radiating to neck and jaw.  Chest pain is worse with deep breath, but is not reproducible on my exam. He has no family history of premature CAD, and no personal history of DM, tobacco abuse, prior MI or CHF.  He is very active as he runs about 40 miles every week without having any symptoms (he exercises 5 days/week about 1.5 to 2 hrs per session without symptoms). His EKG obtained 10/16/2012 shows sinus bradycardia (58 bpm) with no ST- or T-wave changes to suggest ischemia.  His troponin-I is negative. Currently, patient feels better without chest pain. He has no prior diagnosis of HTN but his SBP is currently 174 mmHg. He takes Prilosec for GERD.   Past Medical History  Diagnosis Date  . HSV (herpes simplex virus) infection   . Alcohol abuse, in remission   GERD- on Prilosec  History reviewed. No pertinent past surgical history.  Family History  Problem Relation Age of Onset  . Heart disease Mother   . Colitis Mother   . Heart disease Father     cabg    Social History:  reports that he quit smoking about 12 years ago. He does not have any smokeless tobacco history on file. He reports that  drinks alcohol. He reports that he does not use illicit drugs.  Allergies: No Known Allergies  Medications:   Wellbutrin XL 300 mg qd Clonazepam 0.5 mg q6hr prn Lamitrogen 100 mg qd Zyprexa 2.5 mg qhs Prilosec 20 mg qd Valacyclovir 500 mg qd  Results for orders placed during the hospital encounter of 10/16/12  (from the past 48 hour(s))  CBC     Status: None   Collection Time    10/16/12 10:00 AM      Result Value Range   WBC 7.1  4.0 - 10.5 K/uL   RBC 4.89  4.22 - 5.81 MIL/uL   Hemoglobin 15.1  13.0 - 17.0 g/dL   HCT 54.0  98.1 - 19.1 %   MCV 91.2  78.0 - 100.0 fL   MCH 30.9  26.0 - 34.0 pg   MCHC 33.9  30.0 - 36.0 g/dL   RDW 47.8  29.5 - 62.1 %   Platelets 180  150 - 400 K/uL  BASIC METABOLIC PANEL     Status: Abnormal   Collection Time    10/16/12 10:00 AM      Result Value Range   Sodium 133 (*) 135 - 145 mEq/L   Potassium 3.9  3.5 - 5.1 mEq/L   Chloride 98  96 - 112 mEq/L   CO2 29  19 - 32 mEq/L   Glucose, Bld 103 (*) 70 - 99 mg/dL   BUN 10  6 - 23 mg/dL   Creatinine, Ser 3.08  0.50 - 1.35 mg/dL   Calcium 9.1  8.4 - 65.7 mg/dL   GFR calc non Af Amer >90  >90 mL/min   GFR calc Af Amer >90  >90 mL/min   Comment:  The eGFR has been calculated     using the CKD EPI equation.     This calculation has not been     validated in all clinical     situations.     eGFR's persistently     <90 mL/min signify     possible Chronic Kidney Disease.  PROTIME-INR     Status: None   Collection Time    10/16/12 10:00 AM      Result Value Range   Prothrombin Time 12.3  11.6 - 15.2 seconds   INR 0.93  0.00 - 1.49  D-DIMER, QUANTITATIVE     Status: None   Collection Time    10/16/12 10:00 AM      Result Value Range   D-Dimer, Quant <0.27  0.00 - 0.48 ug/mL-FEU   Comment:            AT THE INHOUSE ESTABLISHED CUTOFF     VALUE OF 0.48 ug/mL FEU,     THIS ASSAY HAS BEEN DOCUMENTED     IN THE LITERATURE TO HAVE     A SENSITIVITY AND NEGATIVE     PREDICTIVE VALUE OF AT LEAST     98 TO 99%.  THE TEST RESULT     SHOULD BE CORRELATED WITH     AN ASSESSMENT OF THE CLINICAL     PROBABILITY OF DVT / VTE.  POCT I-STAT TROPONIN I     Status: None   Collection Time    10/16/12 10:07 AM      Result Value Range   Troponin i, poc 0.01  0.00 - 0.08 ng/mL   Comment 3             Comment: Due to the release kinetics of cTnI,     a negative result within the first hours     of the onset of symptoms does not rule out     myocardial infarction with certainty.     If myocardial infarction is still suspected,     repeat the test at appropriate intervals.  POCT I-STAT TROPONIN I     Status: Abnormal   Collection Time    10/16/12  3:26 PM      Result Value Range   Troponin i, poc 0.10 (*) 0.00 - 0.08 ng/mL   Comment NOTIFIED PHYSICIAN     Comment 3            Comment: Due to the release kinetics of cTnI,     a negative result within the first hours     of the onset of symptoms does not rule out     myocardial infarction with certainty.     If myocardial infarction is still suspected,     repeat the test at appropriate intervals.  TROPONIN I     Status: None   Collection Time    10/16/12  4:34 PM      Result Value Range   Troponin I <0.30  <0.30 ng/mL   Comment:            Due to the release kinetics of cTnI,     a negative result within the first hours     of the onset of symptoms does not rule out     myocardial infarction with certainty.     If myocardial infarction is still suspected,     repeat the test at appropriate intervals.    Dg Chest 2 View  10/16/2012   *RADIOLOGY REPORT*  Clinical Data: Chest pain  CHEST - 2 VIEW  Comparison: None  Findings: The heart size and mediastinal contours are within normal limits.  Both lungs are clear.  The visualized skeletal structures are remarkable for mild multilevel thoracic spondylosis.  IMPRESSION: Negative exam.   Original Report Authenticated By: Signa Kell, M.D.    Review of Systems  Constitutional: Negative.  Negative for fever, chills, weight loss, malaise/fatigue and diaphoresis.  HENT: Positive for neck pain. Negative for hearing loss, ear pain, nosebleeds, congestion, sore throat, tinnitus and ear discharge.   Eyes: Negative for blurred vision, double vision, photophobia, pain, discharge and redness.   Respiratory: Positive for shortness of breath. Negative for cough, hemoptysis, sputum production, wheezing and stridor.   Cardiovascular: Positive for chest pain. Negative for palpitations, orthopnea, claudication, leg swelling and PND.  Gastrointestinal: Negative for heartburn, nausea, vomiting, abdominal pain, diarrhea, constipation, blood in stool and melena.  Genitourinary: Negative for dysuria, urgency, frequency, hematuria and flank pain.  Musculoskeletal: Negative for myalgias, back pain and joint pain.  Skin: Negative for itching and rash.  Neurological: Negative for dizziness, tingling, tremors, sensory change, speech change, focal weakness, seizures, weakness and headaches.  Psychiatric/Behavioral: Negative for hallucinations.   Blood pressure 164/89, pulse 52, temperature 97.6 F (36.4 C), temperature source Oral, resp. rate 15, height 5' 9.5" (1.765 m), weight 67.586 kg (149 lb), SpO2 99.00%. Physical Exam  Constitutional: He is oriented to person, place, and time. He appears well-developed and well-nourished.  HENT:  Head: Normocephalic.  Eyes: Pupils are equal, round, and reactive to light. Right eye exhibits no discharge. Left eye exhibits no discharge. No scleral icterus.  Neck: No JVD present. No thyromegaly present.  Cardiovascular: Regular rhythm and normal heart sounds.  Exam reveals no friction rub.   No murmur heard. Bradycardic (heart rate 58 bpm)  Respiratory: Effort normal and breath sounds normal. No stridor. No respiratory distress. He has no wheezes. He has no rales. He exhibits no tenderness.  GI: Soft. Bowel sounds are normal. He exhibits no distension. There is no tenderness. There is no rebound and no guarding.  Musculoskeletal: He exhibits no edema and no tenderness.  Neurological: He is alert and oriented to person, place, and time.  Skin: No rash noted. No erythema.  Psychiatric: He has a normal mood and affect.    Assessment/Plan:  1. Chest pain  with some typical and atypical features: His EKG and first troponin is unremarkable and patient is currently asymptomatic.  I recommend admitting the patient for observation on telemetry and to obtain serial troponin to rule out MI. If the patient has rising troponin and chest pain, we can perform a cardiac cath on Monday. Otherwise, the patient can be risk stratified with exercise MIBI either on Monday or as outpatient (depending on his clinical condition).  In the meantime, we can start the patient on ASA 81 mg qd, prn nitrates, if he has recurrent chest pain, and obtain fasting lipids in am. He will probably need treatment for HTN if his blood pressure remains high. Please continue Prilosec for GERD.  [name] E 10/16/2012, 6:25 PM

## 2012-10-16 NOTE — ED Notes (Signed)
Patient transported to X-ray 

## 2012-10-16 NOTE — ED Provider Notes (Signed)
Medical screening examination/treatment/procedure(s) were performed by non-physician practitioner and as supervising physician I was immediately available for consultation/collaboration.   Hurman Horn, MD 10/16/12 2224

## 2012-10-16 NOTE — H&P (Signed)
[facility] History and Physical  [name] ZOX:[id number] DOB: 1956/01/02 DOA: 10/16/2012   PCP: Carrie Mew, MD  Specialists: Patient is followed by a psychiatrist for bipolar disorder  Chief Complaint: Chest pain at rest  HPI: [name] is a 57 y.o. male with a past medical history of bipolar disorder, anorexia, who otherwise is in good health, who was in his usual state of health of this morning when he woke up and 30 minutes after he woke up he started experiencing pressure-like sensation in the retrosternal area, as well as in the left side of his chest. The pain was 5/10 in intensity. Radiated to his jaw. He had left arm tingling. He also noted that when he would take deep breaths the pain would increase. He also noticed occasional radiation to the back. He also felt short of breath. However, denies any cough, or fever recently. No dizziness or lightheadedness. Denies any leg swelling. He has never had similar symptoms in the past. Currently, the pain is toward of 10 in intensity. He was given aspirin in the emergency department. He has had acid reflux in the past and he takes Prilosec, but denies any ongoing symptoms currently. He is otherwise a fairly active individual and he runs marathons on regular basis. He actually ran 4 miles yesterday without any chest discomfort. He also is an avid Counselling psychologist. He was evaluated by a cardiologist in [place], [place], last year for reasons that are not entirely clear but patient thinks it was because he was anorexic at that time. Only an EKG was done. No further followup was thought to be necessary.  Home Medications: Prior to Admission medications   Medication Sig Start Date End Date Taking? Authorizing Provider  buPROPion (WELLBUTRIN XL) 300 MG 24 hr tablet Take 1 tablet (300 mg total) by mouth every morning. 06/07/12  Yes Stacie Glaze, MD  clonazePAM (KLONOPIN) 0.5 MG tablet Take 0.5 mg by mouth every 6 (six) hours as needed for  anxiety.   Yes Historical Provider, MD  lamoTRIgine (LAMICTAL) 100 MG tablet Take 100 mg by mouth daily.   Yes Historical Provider, MD  naproxen sodium (ANAPROX) 220 MG tablet Take 220 mg by mouth 2 (two) times daily with a meal.   Yes Historical Provider, MD  OLANZapine (ZYPREXA) 2.5 MG tablet Take 2.5 mg by mouth at bedtime.   Yes Historical Provider, MD  omeprazole (PRILOSEC) 20 MG capsule Take 20 mg by mouth daily.     Yes Historical Provider, MD  traMADol (ULTRAM) 50 MG tablet Take 50 mg by mouth every 6 (six) hours as needed for pain.   Yes Historical Provider, MD  valACYclovir (VALTREX) 500 MG tablet TAKE 1 TABLET BY MOUTH EVERY DAY 07/22/11  Yes Stacie Glaze, MD    Allergies: No Known Allergies  Past Medical History: Past Medical History  Diagnosis Date  . HSV (herpes simplex virus) infection   . Alcohol abuse, in remission     History reviewed. No pertinent past surgical history.  Social History:  reports that he quit smoking about 12 years ago. He does not have any smokeless tobacco history on file. He reports that  drinks alcohol. He reports that he does not use illicit drugs.  Living Situation: He lives majority of the time in [place], but stays in [place] for few months every year. Denies any smoking. He has 2 glasses of wine about 4-5 times a week. Denies any illicit drug use. Activity Level:  independent  Family History:  Family History  Problem Relation Age of Onset  . Heart disease Mother   . Colitis Mother   . Heart disease Father     cabg  Heart disease in his parents occurred at a later age. His father had his first MI in in his 75s. Mother in 16's.  Review of Systems - History obtained from the patient General ROS: negative Psychological ROS: negative Ophthalmic ROS: negative ENT ROS: negative Allergy and Immunology ROS: negative Hematological and Lymphatic ROS: negative Endocrine ROS: negative Respiratory ROS: as in hpi Cardiovascular ROS: as in  hpi Gastrointestinal ROS: no abdominal pain, change in bowel habits, or black or bloody stools Genito-Urinary ROS: no dysuria, trouble voiding, or hematuria Musculoskeletal ROS: negative Neurological ROS: no TIA or stroke symptoms Dermatological ROS: negative  Physical Examination  Filed Vitals:   10/16/12 0945 10/16/12 1459  BP: 174/93 164/89  Pulse: 60 52  Temp: 97.6 F (36.4 C)   TempSrc: Oral   Resp: 18 15  Height: 5' 9.5" (1.765 m)   Weight: 67.586 kg (149 lb)   SpO2: 100% 99%    General appearance: alert, cooperative, appears stated age and no distress Head: Normocephalic, without obvious abnormality, atraumatic Eyes: conjunctivae/corneas clear. PERRL, EOM's intact.  Throat: lips, mucosa, and tongue normal; teeth and gums normal Neck: no adenopathy, no carotid bruit, no JVD, supple, symmetrical, trachea midline and thyroid not enlarged, symmetric, no tenderness/mass/nodules Back: symmetric, no curvature. ROM normal. No CVA tenderness. Resp: clear to auscultation bilaterally Chest wall: mild left sided tenderness on palpation. But did not reproduce the pain that he felt earlier today. Cardio: regular rate and rhythm, S1, S2 normal, no murmur, click, rub or gallop GI: soft, non-tender; bowel sounds normal; no masses,  no organomegaly Extremities: extremities normal, atraumatic, no cyanosis or edema Pulses: 2+ and symmetric Neurologic: Alert and oriented X 3, normal strength and tone. Normal symmetric reflexes. Normal coordination and gait  Laboratory Data: Results for orders placed during the hospital encounter of 10/16/12 (from the past 48 hour(s))  CBC     Status: None   Collection Time    10/16/12 10:00 AM      Result Value Range   WBC 7.1  4.0 - 10.5 K/uL   RBC 4.89  4.22 - 5.81 MIL/uL   Hemoglobin 15.1  13.0 - 17.0 g/dL   HCT 16.1  09.6 - 04.5 %   MCV 91.2  78.0 - 100.0 fL   MCH 30.9  26.0 - 34.0 pg   MCHC 33.9  30.0 - 36.0 g/dL   RDW 40.9  81.1 - 91.4 %    Platelets 180  150 - 400 K/uL  BASIC METABOLIC PANEL     Status: Abnormal   Collection Time    10/16/12 10:00 AM      Result Value Range   Sodium 133 (*) 135 - 145 mEq/L   Potassium 3.9  3.5 - 5.1 mEq/L   Chloride 98  96 - 112 mEq/L   CO2 29  19 - 32 mEq/L   Glucose, Bld 103 (*) 70 - 99 mg/dL   BUN 10  6 - 23 mg/dL   Creatinine, Ser 7.82  0.50 - 1.35 mg/dL   Calcium 9.1  8.4 - 95.6 mg/dL   GFR calc non Af Amer >90  >90 mL/min   GFR calc Af Amer >90  >90 mL/min   Comment:            The eGFR has been calculated  using the CKD EPI equation.     This calculation has not been     validated in all clinical     situations.     eGFR's persistently     <90 mL/min signify     possible Chronic Kidney Disease.  PROTIME-INR     Status: None   Collection Time    10/16/12 10:00 AM      Result Value Range   Prothrombin Time 12.3  11.6 - 15.2 seconds   INR 0.93  0.00 - 1.49  D-DIMER, QUANTITATIVE     Status: None   Collection Time    10/16/12 10:00 AM      Result Value Range   D-Dimer, Quant <0.27  0.00 - 0.48 ug/mL-FEU   Comment:            AT THE INHOUSE ESTABLISHED CUTOFF     VALUE OF 0.48 ug/mL FEU,     THIS ASSAY HAS BEEN DOCUMENTED     IN THE LITERATURE TO HAVE     A SENSITIVITY AND NEGATIVE     PREDICTIVE VALUE OF AT LEAST     98 TO 99%.  THE TEST RESULT     SHOULD BE CORRELATED WITH     AN ASSESSMENT OF THE CLINICAL     PROBABILITY OF DVT / VTE.  POCT I-STAT TROPONIN I     Status: None   Collection Time    10/16/12 10:07 AM      Result Value Range   Troponin i, poc 0.01  0.00 - 0.08 ng/mL   Comment 3            Comment: Due to the release kinetics of cTnI,     a negative result within the first hours     of the onset of symptoms does not rule out     myocardial infarction with certainty.     If myocardial infarction is still suspected,     repeat the test at appropriate intervals.  POCT I-STAT TROPONIN I     Status: Abnormal   Collection Time    10/16/12  3:26  PM      Result Value Range   Troponin i, poc 0.10 (*) 0.00 - 0.08 ng/mL   Comment NOTIFIED PHYSICIAN     Comment 3            Comment: Due to the release kinetics of cTnI,     a negative result within the first hours     of the onset of symptoms does not rule out     myocardial infarction with certainty.     If myocardial infarction is still suspected,     repeat the test at appropriate intervals.  TROPONIN I     Status: None   Collection Time    10/16/12  4:34 PM      Result Value Range   Troponin I <0.30  <0.30 ng/mL   Comment:            Due to the release kinetics of cTnI,     a negative result within the first hours     of the onset of symptoms does not rule out     myocardial infarction with certainty.     If myocardial infarction is still suspected,     repeat the test at appropriate intervals.    Radiology Reports: Dg Chest 2 View  10/16/2012   *RADIOLOGY REPORT*  Clinical Data: Chest pain  CHEST -  2 VIEW  Comparison: None  Findings: The heart size and mediastinal contours are within normal limits.  Both lungs are clear.  The visualized skeletal structures are remarkable for mild multilevel thoracic spondylosis.  IMPRESSION: Negative exam.   Original Report Authenticated By: Signa Kell, M.D.   Electrocardiogram: Sinus rhythm at 50 beats per minute. Normal axis. Normal Intervals. No Q waves. No concerning ST changes.  Problem List  Principal Problem:   Chest pain at rest Active Problems:   Bipolar 1 disorder, mixed   Hyponatremia   Assessment: This is a 57 year old male, who has history of bipolar disorder, who otherwise is in fairly good health and presents with chest pain at rest. Certain features of the pain are concerning for angina but is atypical for the most part. Differential diagnoses include pain due to GI etiology, musculoskeletal. But these are less likely. D-dimer is normal. Blood pressure is noted to be elevated as well.  Plan: #1 chest pain at rest.  The second point of care Troponin was some mildly elevated but actual troponin I level was normal. His blood pressure was noted to be elevated as well. He currently does not appear to be in any discomfort so it is possible that he has underlying hypertension. We'll check a lipid panel. Monitor his blood pressure closely, without treating at this time, unless it is significantly elevated. Patient might benefit from noninvasive testing if he rules out. Discussed with cardiology and they will consult. Aspirin was recommended. ECHO will be ordered. No beta blocker as HR is low.  #2 mild hyponatremia: Could be due to mild hypokalemia. Considering that he is quite physically active we will give normal saline through the night. Repeat labs in the morning.  #3 history of bipolar disorder: Continue with his home medications.  DVT Prophylaxis:  enoxaparin  Code Status:  full code  Family Communication:  discussed with the patient and her sisters with his permission   Disposition Plan: Observe in telemetry    Further management decisions will depend on results of further testing and patient's response to treatment.  [facility]  [facility] Pager [phone]  If 7PM-7AM, please contact night-coverage [email or web address] Password [facility]  10/16/2012, 5:34 PM

## 2012-10-17 ENCOUNTER — Other Ambulatory Visit: Payer: Self-pay | Admitting: Cardiology

## 2012-10-17 DIAGNOSIS — R0789 Other chest pain: Secondary | ICD-10-CM

## 2012-10-17 DIAGNOSIS — K219 Gastro-esophageal reflux disease without esophagitis: Secondary | ICD-10-CM | POA: Diagnosis present

## 2012-10-17 DIAGNOSIS — I517 Cardiomegaly: Secondary | ICD-10-CM

## 2012-10-17 LAB — COMPREHENSIVE METABOLIC PANEL
ALT: 20 U/L (ref 0–53)
AST: 24 U/L (ref 0–37)
Albumin: 3.6 g/dL (ref 3.5–5.2)
CO2: 29 mEq/L (ref 19–32)
Calcium: 9.2 mg/dL (ref 8.4–10.5)
Chloride: 104 mEq/L (ref 96–112)
GFR calc non Af Amer: >90 mL/min (ref >90)
Sodium: 139 mEq/L (ref 135–145)

## 2012-10-17 LAB — CBC
Hemoglobin: 14.8 g/dL (ref 13.0–17.0)
MCH: 30.2 pg (ref 26.0–34.0)
MCHC: 33 g/dL (ref 30.0–36.0)
MCV: 91.4 fL (ref 78.0–100.0)

## 2012-10-17 LAB — LIPID PANEL
Cholesterol: 197 mg/dL (ref 0–200)
LDL Cholesterol: 97 mg/dL (ref 0–99)
VLDL: 19 mg/dL (ref 0–40)

## 2012-10-17 LAB — TROPONIN I: Troponin I: <0.3 ng/mL (ref <0.30)

## 2012-10-17 MED ORDER — ASPIRIN 81 MG PO TBEC: 81.0000 mg | DELAYED_RELEASE_TABLET | Freq: Every day | ORAL | Status: DC

## 2012-10-17 NOTE — Progress Notes (Signed)
  Echocardiogram 2D Echocardiogram has been performed.  Cathie Beams 10/17/2012, 10:33 AM

## 2012-10-17 NOTE — Discharge Summary (Signed)
Physician Discharge Summary  [name] UJW:[id number] DOB: March 15, 1956 DOA: 10/16/2012  PCP: Carrie Mew, MD  Admit date: 10/16/2012 Discharge date: 10/17/2012  Recommendations for Outpatient Follow-up:  1. F/U [facility] for stress test in 1 week. 2. F/U 2 D Echocardiogram, done on 10/17/12.  Discharge Diagnoses:  Principal Problem:    Chest pain at rest Active Problems:    Bipolar 1 disorder, mixed    Hyponatremia    GERD    Elevated blood pressure reading   Discharge Condition: Stable.  Diet recommendation: Low sodium, heart healthy.  History of present illness:  [name] is an 57 y.o. male with a past medical history of bipolar disorder and a prior history of alcoholism in remission, was admitted on 10/16/2012 with chest pain and elevated blood pressure. Initial troponin was negative and EKG showed sinus bradycardia no ST or T wave changes.  Hospital Course by problem:  Principal Problem:  Chest pain at rest  -Continue aspirin.  -Followup echocardiogram.  -No beta blocker secondary to bradycardia.  -Evaluated by cardiology on 10/16/2012.  -Troponins negative x3.  -FLP WNL.  -With negative troponins, no EKG changes, no elevated cholesterol, patient very low risk for cardiac disease.  He runs 40 miles/week.  He has been cleared for d/c by cardiologist with recommendations to follow up with [facility] for a stress test this week. Active Problems:  GERD  -Continue PPI.  Bipolar 1 disorder, mixed  -Continue Wellbutrin, Klonopin, Lamictal and Zyprexa.  Hyponatremia  -Resolved.  HTN  -Not on anti-hypertensives prior to admission.  Procedures:  2 D Echocardiogram 10/17/12: Results pending.  Consultations:  Dr. Cassell Clement, Cardiology.  Discharge Exam: Filed Vitals:   10/17/12 0528  BP: 123/76  Pulse: 52  Temp: 98.2 F (36.8 C)  Resp: 16   Filed Vitals:   10/16/12 1459 10/16/12 1915 10/16/12 2040 10/17/12 0528  BP: 164/89 167/83  147/84 123/76  Pulse: 52 55 45 52  Temp:  98.3 F (36.8 C) 97.7 F (36.5 C) 98.2 F (36.8 C)  TempSrc:  Oral Oral Oral  Resp: 15 20 16 16   Height:  5\' 9"  (1.753 m)    Weight:      SpO2: 99%  100% 98%    Gen:  NAD Cardiovascular:  RRR, No M/R/G Respiratory: Lungs CTAB Gastrointestinal: Abdomen soft, NT/ND with normal active bowel sounds. Extremities: No C/E/C   Discharge Instructions  Discharge Orders   Future Orders Complete By Expires     Call MD for:  extreme fatigue  As directed     Call MD for:  persistant nausea and vomiting  As directed     Call MD for:  severe uncontrolled pain  As directed     Diet - low sodium heart healthy  As directed     Discharge instructions  As directed     Comments:      You were cared for by Dr. Hillery Aldo  (a hospitalist) during your hospital stay. If you have any questions about your discharge medications or the care you received while you were in the hospital after you are discharged, you can call the unit and ask to speak with the hospitalist on call if the hospitalist that took care of you is not available. Once you are discharged, your primary care physician will handle any further medical issues. Please note that NO REFILLS for any discharge medications will be authorized once you are discharged, as it is imperative that you return to  your primary care physician (or establish a relationship with a primary care physician if you do not have one) for your aftercare needs so that they can reassess your need for medications and monitor your lab values.  Any outstanding tests can be reviewed by your PCP at your follow up visit.  It is also important to review any medicine changes with your PCP.  Please bring these d/c instructions with you to your next visit so your physician can review these changes with you.  If you do not have a primary care physician, you can call [phone] for a physician referral.  It is highly recommended that you obtain a  PCP for hospital follow up.    Increase activity slowly  As directed         Medication List         aspirin 81 MG EC tablet  Take 1 tablet (81 mg total) by mouth daily.     buPROPion 300 MG 24 hr tablet  Commonly known as:  WELLBUTRIN XL  Take 1 tablet (300 mg total) by mouth every morning.     clonazePAM 0.5 MG tablet  Commonly known as:  KLONOPIN  Take 0.5 mg by mouth every 6 (six) hours as needed for anxiety.     lamoTRIgine 100 MG tablet  Commonly known as:  LAMICTAL  Take 100 mg by mouth daily.     naproxen sodium 220 MG tablet  Commonly known as:  ANAPROX  Take 220 mg by mouth 2 (two) times daily with a meal.     omeprazole 20 MG capsule  Commonly known as:  PRILOSEC  Take 20 mg by mouth daily.     traMADol 50 MG tablet  Commonly known as:  ULTRAM  Take 50 mg by mouth every 6 (six) hours as needed for pain.     valACYclovir 500 MG tablet  Commonly known as:  VALTREX  TAKE 1 TABLET BY MOUTH EVERY DAY     ZYPREXA 2.5 MG tablet  Generic drug:  OLANZapine  Take 2.5 mg by mouth at bedtime.           Follow-up Information   Follow up with [facility]). Schedule an appointment as soon as possible for a visit in 1 week. (Office will call you 10/17/12 to set up appt time)    Contact information:   [address] [place] [address] [phone]       The results of significant diagnostics from this hospitalization (including imaging, microbiology, ancillary and laboratory) are listed below for reference.    Significant Diagnostic Studies: Dg Chest 2 View  10/16/2012   *RADIOLOGY REPORT*  Clinical Data: Chest pain  CHEST - 2 VIEW  Comparison: None  Findings: The heart size and mediastinal contours are within normal limits.  Both lungs are clear.  The visualized skeletal structures are remarkable for mild multilevel thoracic spondylosis.  IMPRESSION: Negative exam.   Original Report Authenticated By: Signa Kell, M.D.     Labs:  Basic Metabolic Panel:  Recent Labs Lab 10/16/12 1000 10/17/12 0538  NA 133* 139  K 3.9 4.0  CL 98 104  CO2 29 29  GLUCOSE 103* 92  BUN 10 11  CREATININE 0.76 0.89  CALCIUM 9.1 9.2   GFR Estimated Creatinine Clearance: 89.7 ml/min (by C-G formula based on Cr of 0.89). Liver Function Tests:  Recent Labs Lab 10/17/12 0538  AST 24  ALT 20  ALKPHOS 46  BILITOT 1.3*  PROT 6.6  ALBUMIN 3.6   Coagulation profile  Recent Labs Lab 10/16/12 1000  INR 0.93    CBC:  Recent Labs Lab 10/16/12 1000 10/17/12 0538  WBC 7.1 5.9  HGB 15.1 14.8  HCT 44.6 44.8  MCV 91.2 91.4  PLT 180 181   Cardiac Enzymes:  Recent Labs Lab 10/16/12 1634 10/16/12 2310 10/17/12 0538  TROPONINI <0.30 <0.30 <0.30   D-Dimer  Recent Labs  10/16/12 1000  DDIMER <0.27   Lipid Profile  Recent Labs  10/17/12 0538  CHOL 197  HDL 81  LDLCALC 97  TRIG 96  CHOLHDL 2.4    Time coordinating discharge: 25 minutes.  Signed:  RAMA,CHRISTINA  Pager [phone] [facility] 10/17/2012, 10:40 AM

## 2012-10-17 NOTE — Progress Notes (Addendum)
Subjective:  Patient had no chest pain overnight.  BP is normal this am. Troponins are normal. EKG last pm normal.  Objective:  Vital Signs in the last 24 hours: Temp:  [97.6 F (36.4 C)-98.3 F (36.8 C)] 98.2 F (36.8 C) (07/06 0528) Pulse Rate:  [45-60] 52 (07/06 0528) Resp:  [15-20] 16 (07/06 0528) BP: (123-174)/(76-93) 123/76 mmHg (07/06 0528) SpO2:  [98 %-100 %] 98 % (07/06 0528) Weight:  [149 lb (67.586 kg)] 149 lb (67.586 kg) (07/05 0945)  Intake/Output from previous day: 07/05 0701 - 07/06 0700 In: 240 [P.O.:240] Out: 500 [Urine:500] Intake/Output from this shift:    . aspirin EC  81 mg Oral Daily  . buPROPion  300 mg Oral Daily  . enoxaparin (LOVENOX) injection  40 mg Subcutaneous Q24H  . lamoTRIgine  100 mg Oral Daily  . OLANZapine  2.5 mg Oral QHS  . pantoprazole  40 mg Oral Daily  . sodium chloride  3 mL Intravenous Q12H      Physical Exam: The patient appears to be in no distress.  Head and neck exam reveals that the pupils are equal and reactive.  The extraocular movements are full.  There is no scleral icterus.  Mouth and pharynx are benign.  No lymphadenopathy.  No carotid bruits.  The jugular venous pressure is normal.  Thyroid is not enlarged or tender.  Chest is clear to percussion and auscultation.  No rales or rhonchi.  Expansion of the chest is symmetrical.  Heart reveals no abnormal lift or heave.  First and second heart sounds are normal.  There is no murmur gallop  or click. Careful auscultation reveals no rub.  The abdomen is soft and nontender.  Bowel sounds are normoactive.  There is no hepatosplenomegaly or mass.  There are no abdominal bruits.  Extremities reveal no phlebitis or edema.  Pedal pulses are good.  There is no cyanosis or clubbing.  Neurologic exam is normal strength and no lateralizing weakness.  No sensory deficits.  Integument reveals no rash  Lab Results:  Recent Labs  10/16/12 1000 10/17/12 0538  WBC 7.1 5.9    HGB 15.1 14.8  PLT 180 181    Recent Labs  10/16/12 1000 10/17/12 0538  NA 133* 139  K 3.9 4.0  CL 98 104  CO2 29 29  GLUCOSE 103* 92  BUN 10 11  CREATININE 0.76 0.89    Recent Labs  10/16/12 2310 10/17/12 0538  TROPONINI <0.30 <0.30   Hepatic Function Panel  Recent Labs  10/17/12 0538  PROT 6.6  ALBUMIN 3.6  AST 24  ALT 20  ALKPHOS 46  BILITOT 1.3*   No results found for this basename: CHOL,  in the last 72 hours No results found for this basename: PROTIME,  in the last 72 hours  Imaging: Dg Chest 2 View  10/16/2012   *RADIOLOGY REPORT*  Clinical Data: Chest pain  CHEST - 2 VIEW  Comparison: None  Findings: The heart size and mediastinal contours are within normal limits.  Both lungs are clear.  The visualized skeletal structures are remarkable for mild multilevel thoracic spondylosis.  IMPRESSION: Negative exam.   Original Report Authenticated By: Signa Kell, M.D.    Cardiac Studies: Telemetry reveals NSR Assessment/Plan:  Chest pain with typical and atypical features.No evidence on MI. No evidence of pericarditis.  Plan: Patient is anxious for discharge. Ok for discharge today and we can plan to do outpatient treadmill myoview at [facility] early this week. Home  on [place]. I will arrange for [facility] office to call him Monday morning at his home phone [phone] to set up time for stress test.    LOS: 1 day    [name] 10/17/2012, 7:51 AM

## 2012-10-18 ENCOUNTER — Telehealth: Payer: Self-pay | Admitting: Cardiology

## 2012-10-18 NOTE — Telephone Encounter (Signed)
New Prob     Pt needing to speak to nurse regarding an order for a stress test. Please call.

## 2012-10-18 NOTE — Telephone Encounter (Signed)
Pt has an app for a treadmill myoview but it is not convenient for him, pt was seen in [place] over the weekend. Pt wants it at [facility] hosp in [place].  I am not sure we have order privileges for that area. I spoke with Tereso Newcomer PA and it was confirmed that our Physicians do not have ordering privileges their. I left a msg for the pt with the information and told him to call back if any further questions.

## 2012-10-25 ENCOUNTER — Ambulatory Visit (HOSPITAL_COMMUNITY): Payer: BC Managed Care – PPO | Attending: Cardiovascular Disease | Admitting: Radiology

## 2012-10-25 VITALS — BP 125/87 | HR 44 | Ht 69.0 in | Wt 147.0 lb

## 2012-10-25 DIAGNOSIS — R0789 Other chest pain: Secondary | ICD-10-CM

## 2012-10-25 DIAGNOSIS — R0602 Shortness of breath: Secondary | ICD-10-CM

## 2012-10-25 DIAGNOSIS — R079 Chest pain, unspecified: Secondary | ICD-10-CM | POA: Insufficient documentation

## 2012-10-25 DIAGNOSIS — Z87891 Personal history of nicotine dependence: Secondary | ICD-10-CM | POA: Insufficient documentation

## 2012-10-25 DIAGNOSIS — Z8249 Family history of ischemic heart disease and other diseases of the circulatory system: Secondary | ICD-10-CM | POA: Insufficient documentation

## 2012-10-25 MED ORDER — TECHNETIUM TC 99M SESTAMIBI GENERIC - CARDIOLITE
30.0000 | Freq: Once | INTRAVENOUS | Status: AC | PRN
  Administered 2012-10-25: 30 via INTRAVENOUS

## 2012-10-25 MED ORDER — TECHNETIUM TC 99M SESTAMIBI GENERIC - CARDIOLITE
10.0000 | Freq: Once | INTRAVENOUS | Status: AC | PRN
  Administered 2012-10-25: 10 via INTRAVENOUS

## 2012-10-25 NOTE — Progress Notes (Signed)
[facility] SITE 3 NUCLEAR MED [address] [place], [place] [address] [phone]    Cardiology Nuclear Med Study  [name] is a 57 y.o. male     MRN : [id number]     DOB: [birth year]  Procedure Date: 10/25/2012  Nuclear Med Background Indication for Stress Test:  Evaluation for Ischemia and Post Hospital ED on  10/16/12 with Chest Pain, Enzymes were negative History:  10/17/12 Echo:EF=55%, mild LVH Cardiac Risk Factors: Family History - CAD and History of Smoking  Symptoms:  Chest Pressure with and without Exertion (last episode of chest discomfort has been none since discharge), Fatigue and SOB   Nuclear Pre-Procedure Caffeine/Decaff Intake:  None > 12 hrs NPO After: 9:30pm   Lungs:  Clear. O2 Sat: 99% on room air. IV 0.9% NS with Angio Cath:  20g  IV Site: R Antecubital x 1, tolerated well IV Started by:  Irean Hong, RN  Chest Size (in):  40 Cup Size: n/a  Height: 5\' 9"  (1.753 m)  Weight:  147 lb (66.679 kg)  BMI:  Body mass index is 21.7 kg/(m^2). Tech Comments:  n/a    Nuclear Med Study 1 or 2 day study: 1 day  Stress Test Type:  Stress  Reading MD: Charlton Haws, MD  Order Authorizing Provider:  Cassell Clement, MD  Resting Radionuclide: Technetium 45m Sestamibi  Resting Radionuclide Dose: 11.0 mCi   Stress Radionuclide:  Technetium 68m Sestamibi  Stress Radionuclide Dose: 33.0 mCi           Stress Protocol Rest HR: 44 Stress HR: 142  Rest BP: 125/87 Stress BP: 175/84  Exercise Time (min): 14:45 METS: 17.2   Predicted Max HR: 165 bpm % Max HR: 86.06 bpm Rate Pressure Product: 41324   Dose of Adenosine (mg):  n/a Dose of Lexiscan: n/a mg  Dose of Atropine (mg): n/a Dose of Dobutamine: n/a mcg/kg/min (at max HR)  Stress Test Technologist: Smiley Houseman, CMA-N  Nuclear Technologist:  Domenic Polite, CNMT     Rest Procedure:  Myocardial perfusion imaging was performed at rest 45 minutes following the intravenous administration of Technetium  46m Sestamibi.  Rest ECG: NSR voltage for LVH  Stress Procedure:  The patient exercised on the treadmill utilizing the Bruce Protocol for 14:45 minutes. The patient stopped due to fatigue and denied any chest pain.  Technetium 50m Sestamibi was injected at peak exercise and myocardial perfusion imaging was performed after a brief delay.  Stress ECG: No significant change from baseline ECG  QPS Raw Data Images:  Normal; no motion artifact; normal heart/lung ratio. Stress Images:  Normal homogeneous uptake in all areas of the myocardium. Rest Images:  Normal homogeneous uptake in all areas of the myocardium. Subtraction (SDS):  Normal Transient Ischemic Dilatation (Normal <1.22):  n/a Lung/Heart Ratio (Normal <0.45):  0.40  Quantitative Gated Spect Images QGS EDV:  133 ml QGS ESV:  59 ml  Impression Exercise Capacity:  Excellent exercise capacity. BP Response:  Normal blood pressure response. Clinical Symptoms:  No significant symptoms noted. ECG Impression:  No significant ST segment change suggestive of ischemia. Comparison with Prior Nuclear Study: No previous nuclear study performed  Overall Impression:  Normal stress nuclear study.  LV Ejection Fraction: 56%.  LV Wall Motion:  NL LV Function; NL Wall Motion  Charlton Haws

## 2012-10-26 ENCOUNTER — Other Ambulatory Visit: Payer: Self-pay | Admitting: Internal Medicine

## 2012-10-27 ENCOUNTER — Telehealth: Payer: Self-pay | Admitting: Cardiology

## 2012-10-27 NOTE — Telephone Encounter (Signed)
Advised patient of results.  

## 2012-10-27 NOTE — Telephone Encounter (Signed)
Left message to call back  

## 2012-10-27 NOTE — Telephone Encounter (Signed)
Message copied by Burnell Blanks on Wed Oct 27, 2012  9:46 AM ------      Message from: Cassell Clement      Created: Tue Oct 26, 2012  9:05 PM       Please report.  Stress test normal. ------

## 2012-10-27 NOTE — Progress Notes (Signed)
Left message on machine.

## 2012-10-27 NOTE — Telephone Encounter (Signed)
Follow Up ° ° ° ° °Pt calling following up on test results. Please call. °

## 2012-11-15 ENCOUNTER — Telehealth: Payer: Self-pay | Admitting: Internal Medicine

## 2012-11-15 NOTE — Telephone Encounter (Signed)
i gave him an appointment on 8/29 at 11:45-I have put on schedule if you will call patient

## 2012-11-15 NOTE — Telephone Encounter (Signed)
lmovm /ga °

## 2012-11-15 NOTE — Telephone Encounter (Signed)
PT called to schedule his CPX for as soon as possible. He states that he is not willing to wait until February. Please assist.

## 2012-11-15 NOTE — Telephone Encounter (Signed)
Would you be willing to see this PT for his CPX, as he is requesting that it be completed ASAP. He would prefer not to wait until January for Dr. Lovell Sheehan. Thank you!

## 2012-11-15 NOTE — Telephone Encounter (Signed)
S.w pt, he will check his schedule and call us back.

## 2012-11-15 NOTE — Telephone Encounter (Signed)
Please see first response

## 2012-11-17 NOTE — Telephone Encounter (Signed)
Scheduled

## 2012-11-17 NOTE — Telephone Encounter (Signed)
ok 

## 2012-12-08 ENCOUNTER — Other Ambulatory Visit (INDEPENDENT_AMBULATORY_CARE_PROVIDER_SITE_OTHER): Payer: BC Managed Care – PPO

## 2012-12-08 DIAGNOSIS — Z0000 Encounter for general adult medical examination without abnormal findings: Secondary | ICD-10-CM

## 2012-12-08 LAB — POCT URINALYSIS DIPSTICK
Bilirubin, UA: NEGATIVE
Blood, UA: NEGATIVE
Glucose, UA: NEGATIVE
Ketones, UA: NEGATIVE
Leukocytes, UA: NEGATIVE
Nitrite, UA: NEGATIVE
Protein, UA: NEGATIVE
Spec Grav, UA: 1.01
Urobilinogen, UA: 0.2
pH, UA: 7

## 2012-12-08 LAB — CBC WITH DIFFERENTIAL/PLATELET
Basophils Absolute: 0 10*3/uL (ref 0.0–0.1)
Basophils Relative: 0.5 % (ref 0.0–3.0)
Eosinophils Absolute: 0.3 10*3/uL (ref 0.0–0.7)
Eosinophils Relative: 4.8 % (ref 0.0–5.0)
HCT: 44.3 % (ref 39.0–52.0)
Hemoglobin: 15 g/dL (ref 13.0–17.0)
Lymphocytes Relative: 36 % (ref 12.0–46.0)
Lymphs Abs: 1.9 10*3/uL (ref 0.7–4.0)
MCHC: 33.8 g/dL (ref 30.0–36.0)
MCV: 91 fl (ref 78.0–100.0)
Monocytes Absolute: 0.5 10*3/uL (ref 0.1–1.0)
Monocytes Relative: 9.1 % (ref 3.0–12.0)
Neutro Abs: 2.6 10*3/uL (ref 1.4–7.7)
Neutrophils Relative %: 49.6 % (ref 43.0–77.0)
Platelets: 178 10*3/uL (ref 150.0–400.0)
RBC: 4.87 Mil/uL (ref 4.22–5.81)
RDW: 12.7 % (ref 11.5–14.6)
WBC: 5.3 10*3/uL (ref 4.5–10.5)

## 2012-12-09 LAB — LIPID PANEL
Cholesterol: 186 mg/dL (ref 0–200)
LDL Cholesterol: 112 mg/dL — ABNORMAL HIGH (ref 0–99)
Total CHOL/HDL Ratio: 3

## 2012-12-09 LAB — BASIC METABOLIC PANEL
BUN: 10 mg/dL (ref 6–23)
CO2: 31 mEq/L (ref 19–32)
Chloride: 100 mEq/L (ref 96–112)
Glucose, Bld: 85 mg/dL (ref 70–99)
Potassium: 4.3 mEq/L (ref 3.5–5.1)

## 2012-12-09 LAB — GC/CHLAMYDIA PROBE AMP, URINE
Chlamydia, Swab/Urine, PCR: NEGATIVE
GC Probe Amp, Urine: NEGATIVE

## 2012-12-09 LAB — HIV ANTIBODY (ROUTINE TESTING W REFLEX): HIV: NONREACTIVE

## 2012-12-09 LAB — HEPATIC FUNCTION PANEL: Albumin: 4.3 g/dL (ref 3.5–5.2)

## 2012-12-09 LAB — TSH: TSH: 1.22 u[IU]/mL (ref 0.35–5.50)

## 2012-12-09 LAB — PSA: PSA: 0.5 ng/mL (ref 0.10–4.00)

## 2012-12-10 ENCOUNTER — Encounter: Payer: BC Managed Care – PPO | Admitting: Internal Medicine

## 2012-12-14 ENCOUNTER — Telehealth: Payer: Self-pay | Admitting: *Deleted

## 2012-12-14 NOTE — Telephone Encounter (Signed)
Left message on voicemail to call office., see lab result note.

## 2012-12-17 ENCOUNTER — Ambulatory Visit (INDEPENDENT_AMBULATORY_CARE_PROVIDER_SITE_OTHER): Payer: BC Managed Care – PPO | Admitting: Internal Medicine

## 2012-12-17 ENCOUNTER — Encounter: Payer: Self-pay | Admitting: Internal Medicine

## 2012-12-17 VITALS — BP 108/60 | HR 68 | Temp 98.1°F | Resp 18 | Ht 68.5 in | Wt 143.0 lb

## 2012-12-17 DIAGNOSIS — Z0000 Encounter for general adult medical examination without abnormal findings: Secondary | ICD-10-CM

## 2012-12-17 MED ORDER — BUPROPION HCL ER (XL) 300 MG PO TB24: 300.0000 mg | ORAL_TABLET | ORAL | Status: AC

## 2012-12-17 MED ORDER — CLONAZEPAM 0.5 MG PO TABS: 0.5000 mg | ORAL_TABLET | Freq: Four times a day (QID) | ORAL | Status: AC | PRN

## 2012-12-17 NOTE — Progress Notes (Signed)
  Subjective:    Patient ID: [name], male    DOB: 1956/04/05, 57 y.o.   MRN: [id number]  HPI    Review of Systems     Objective:   Physical Exam        Assessment & Plan:

## 2012-12-17 NOTE — Patient Instructions (Addendum)
It is important that you exercise regularly, at least 20 minutes 3 to 4 times per week.  If you develop chest pain or shortness of breath seek  medical attention.  Return in one year for follow-up   

## 2013-03-01 ENCOUNTER — Other Ambulatory Visit: Payer: Self-pay | Admitting: *Deleted

## 2013-03-01 MED ORDER — TADALAFIL 5 MG PO TABS: ORAL_TABLET | ORAL | Status: DC

## 2013-04-29 ENCOUNTER — Encounter: Payer: Self-pay | Admitting: Internal Medicine

## 2013-04-29 ENCOUNTER — Ambulatory Visit (INDEPENDENT_AMBULATORY_CARE_PROVIDER_SITE_OTHER): Payer: BC Managed Care – PPO | Admitting: Internal Medicine

## 2013-04-29 VITALS — BP 130/80 | HR 60 | Temp 98.2°F | Resp 16 | Ht 68.5 in | Wt 148.0 lb

## 2013-04-29 DIAGNOSIS — E291 Testicular hypofunction: Secondary | ICD-10-CM

## 2013-04-29 DIAGNOSIS — Z202 Contact with and (suspected) exposure to infections with a predominantly sexual mode of transmission: Secondary | ICD-10-CM

## 2013-04-29 DIAGNOSIS — Z23 Encounter for immunization: Secondary | ICD-10-CM

## 2013-04-29 NOTE — Progress Notes (Signed)
Subjective:    Patient ID: [name], male    DOB: 1956-01-24, 58 y.o.   MRN: [id number]  HPI Patient is "stable" Had a visit with Dr Raliegh Ip for CPX Had episode of chest pain and had evaluation for possible cardiac injury  Weight loss due to exercise and stress. Has gained some weight Had exercise anorexia Weight had recovered Wants to get of the lamictal HRT stable   Review of Systems  Constitutional: Negative for fever and fatigue.  HENT: Negative for congestion, hearing loss and postnasal drip.   Eyes: Negative for discharge, redness and visual disturbance.  Respiratory: Negative for cough, shortness of breath and wheezing.   Cardiovascular: Negative for leg swelling.  Gastrointestinal: Negative for abdominal pain, constipation and abdominal distention.  Genitourinary: Negative for urgency and frequency.  Musculoskeletal: Negative for arthralgias, joint swelling and neck pain.  Skin: Negative for color change and rash.  Neurological: Negative for weakness and light-headedness.  Hematological: Negative for adenopathy.  Psychiatric/Behavioral: Negative for behavioral problems.   Past Medical History  Diagnosis Date  . HSV (herpes simplex virus) infection   . Alcohol abuse, in remission     History   Social History  . Marital Status: Divorced    Spouse Name: N/A    Number of Children: N/A  . Years of Education: N/A   Occupational History  . professor [employer]   Social History Main Topics  . Smoking status: Former Smoker    Quit date: 06/23/2000  . Smokeless tobacco: Not on file     Comment: quit in 2000  . Alcohol Use: Yes  . Drug Use: No  . Sexual Activity: Yes   Other Topics Concern  . Not on file   Social History Narrative  . No narrative on file    No past surgical history on file.  Family History  Problem Relation Age of Onset  . Heart disease Mother   . Colitis Mother   . Heart disease Father     cabg    No Known Allergies  Current  Outpatient Prescriptions on File Prior to Visit  Medication Sig Dispense Refill  . buPROPion (WELLBUTRIN XL) 300 MG 24 hr tablet Take 1 tablet (300 mg total) by mouth every morning.  90 tablet  3  . clonazePAM (KLONOPIN) 0.5 MG tablet Take 1 tablet (0.5 mg total) by mouth every 6 (six) hours as needed for anxiety.  60 tablet  3  . lamoTRIgine (LAMICTAL) 100 MG tablet Take 100 mg by mouth daily.      Marland Kitchen omeprazole (PRILOSEC) 20 MG capsule Take 20 mg by mouth daily.        . tadalafil (CIALIS) 5 MG tablet TAKE 1 TABLET EVERY DAY AS NEEDED FOR ERECTILE DYSFUNCTION  10 tablet  5  . valACYclovir (VALTREX) 500 MG tablet 500 mg daily as needed.        No current facility-administered medications on file prior to visit.    BP 130/80  Pulse 60  Temp(Src) 98.2 F (36.8 C)  Resp 16  Ht 5' 8.5" (1.74 m)  Wt 148 lb (67.132 kg)  BMI 22.17 kg/m2        Objective:   Physical Exam  Constitutional: He appears well-developed and well-nourished.  HENT:  Head: Normocephalic and atraumatic.  Eyes: Conjunctivae are normal. Pupils are equal, round, and reactive to light.  Neck: Normal range of motion. Neck supple.  Cardiovascular: Normal rate and regular rhythm.   Pulmonary/Chest: Effort normal and breath  sounds normal.  Abdominal: Soft. Bowel sounds are normal.          Assessment & Plan:  Mania and lamictal Discussion of risk of being of the lamictal  Ed Evaluate testosterone levels free and total testosterone and discuss replacement probably undergone topical replacement if levels are low.  Also STD exposure testing warranted

## 2013-04-29 NOTE — Patient Instructions (Signed)
The patient is instructed to continue all medications as prescribed. Schedule followup with check out clerk upon leaving the clinic  

## 2013-04-29 NOTE — Progress Notes (Signed)
Pre visit review using our clinic review tool, if applicable. No additional management support is needed unless otherwise documented below in the visit note. 

## 2013-04-30 LAB — GC/CHLAMYDIA PROBE AMP, URINE
CHLAMYDIA, SWAB/URINE, PCR: NEGATIVE
GC PROBE AMP, URINE: NEGATIVE

## 2013-04-30 LAB — HIV ANTIBODY (ROUTINE TESTING W REFLEX): HIV: NONREACTIVE

## 2013-04-30 LAB — RPR

## 2013-05-02 LAB — TESTOSTERONE, FREE, TOTAL, SHBG
SEX HORMONE BINDING: 71 nmol/L (ref 13–71)
TESTOSTERONE FREE: 52.7 pg/mL (ref 47.0–244.0)
TESTOSTERONE-% FREE: 1.2 % — AB (ref 1.6–2.9)
TESTOSTERONE: 440 ng/dL (ref 300–890)

## 2013-07-19 ENCOUNTER — Encounter: Payer: Self-pay | Admitting: Family Medicine

## 2013-07-19 ENCOUNTER — Ambulatory Visit (INDEPENDENT_AMBULATORY_CARE_PROVIDER_SITE_OTHER): Payer: BC Managed Care – PPO | Admitting: Family Medicine

## 2013-07-19 VITALS — BP 102/70 | Temp 97.8°F | Wt 148.0 lb

## 2013-07-19 DIAGNOSIS — B356 Tinea cruris: Secondary | ICD-10-CM

## 2013-07-19 MED ORDER — TERBINAFINE HCL 250 MG PO TABS: 250.0000 mg | ORAL_TABLET | Freq: Every day | ORAL | Status: DC

## 2013-07-19 NOTE — Progress Notes (Signed)
Pre visit review using our clinic review tool, if applicable. No additional management support is needed unless otherwise documented below in the visit note. 

## 2013-07-19 NOTE — Progress Notes (Signed)
Chief Complaint  Patient presents with  . Recurrent Skin Infections    HPI:  [name]: -has had flare in the past; swimmer -started about 4-6 weeks ago - did clotrimazole for bid for a few weeks -this cleared up the atheletes foot but not the [name] -denies: fevers, malaise, other symtoms ROS: See pertinent positives and negatives per HPI.  Past Medical History  Diagnosis Date  . HSV (herpes simplex virus) infection   . Alcohol abuse, in remission     No past surgical history on file.  Family History  Problem Relation Age of Onset  . Heart disease Mother   . Colitis Mother   . Heart disease Father     cabg    History   Social History  . Marital Status: Divorced    Spouse Name: N/A    Number of Children: N/A  . Years of Education: N/A   Occupational History  . professor [employer]   Social History Main Topics  . Smoking status: Former Smoker    Quit date: 06/23/2000  . Smokeless tobacco: None     Comment: quit in 2000  . Alcohol Use: Yes  . Drug Use: No  . Sexual Activity: Yes   Other Topics Concern  . None   Social History Narrative  . None    Current outpatient prescriptions:buPROPion (WELLBUTRIN XL) 300 MG 24 hr tablet, Take 1 tablet (300 mg total) by mouth every morning., Disp: 90 tablet, Rfl: 3;  clonazePAM (KLONOPIN) 0.5 MG tablet, Take 1 tablet (0.5 mg total) by mouth every 6 (six) hours as needed for anxiety., Disp: 60 tablet, Rfl: 3;  omeprazole (PRILOSEC) 20 MG capsule, Take 20 mg by mouth daily.  , Disp: , Rfl:  tadalafil (CIALIS) 5 MG tablet, TAKE 1 TABLET EVERY DAY AS NEEDED FOR ERECTILE DYSFUNCTION, Disp: 10 tablet, Rfl: 5;  valACYclovir (VALTREX) 500 MG tablet, 500 mg daily as needed. , Disp: , Rfl: ;  lamoTRIgine (LAMICTAL) 100 MG tablet, Take 100 mg by mouth daily., Disp: , Rfl: ;  terbinafine (LAMISIL) 250 MG tablet, Take 1 tablet (250 mg total) by mouth daily., Disp: 14 tablet, Rfl: 0  EXAM:  Filed Vitals:   07/19/13 0935  BP: 102/70   Temp: 97.8 F (36.6 C)    Body mass index is 22.17 kg/(m^2).  GENERAL: vitals reviewed and listed above, alert, oriented, appears well hydrated and in no acute distress  HEENT: atraumatic, conjunttiva clear, no obvious abnormalities on inspection of external nose and ears  NECK: no obvious masses on inspection  SKIN: erythematous scally rash in bilat grion and uppe rthigh  MS: moves all extremities without noticeable abnormality  PSYCH: pleasant and cooperative, no obvious depression or anxiety  ASSESSMENT AND PLAN:  Discussed the following assessment and plan:  Tinea cruris - Plan: terbinafine (LAMISIL) 250 MG tablet  -we discussed possible serious and likely etiologies, workup and treatment, treatment risks and return precautions -after this discussion, [name] opted for terbinafine for 2 weeks, offered lft's but he had with physical and normal and prefers not to check -follow up advised as needed -of course, we advised [name]  to return or notify a doctor immediately if symptoms worsen or persist or new concerns arise.  .  -Patient advised to return or notify a doctor immediately if symptoms worsen or persist or new concerns arise.  There are no Patient Instructions on file for this visit.   Colin Benton R.

## 2013-08-05 ENCOUNTER — Other Ambulatory Visit: Payer: Self-pay | Admitting: Family Medicine

## 2013-08-09 ENCOUNTER — Telehealth: Payer: Self-pay | Admitting: Internal Medicine

## 2013-08-09 NOTE — Telephone Encounter (Signed)
Patient Information:  Caller Name: Irvan  Phone: [phone]  Patient: [name], [name]  Gender: Male  DOB: 03-05-1956  Age: 58 Years  PCP: Benay Pillow (Adults only)  Office Follow Up:  Does the office need to follow up with this patient?: No  Instructions For The Office: N/A   Symptoms  Reason For Call & Symptoms: Patient calling about jock itch; onset estimated 4 weeks or longer.  Seen by Dr. Maudie Mercury; he was on Lamisil x 2 weeks, which did not clear it up; he relates it seemed to improve initially but has returned.  Emergent symptoms ruled out.  See Today or Tomorrow in Office per Jock Itch guideline due to After week on treatment and rash has not improved.  Reviewed Health History In EMR: Yes  Reviewed Medications In EMR: Yes  Reviewed Allergies In EMR: Yes  Reviewed Surgeries / Procedures: Yes  Date of Onset of Symptoms: 07/05/2013  Treatments Tried: Lamisil  Treatments Tried Worked: No  Guideline(s) Used:  Unknown Jim  Disposition Per Guideline:   See Today or Tomorrow in Office  Reason For Disposition Reached:   After week on treatment and rash has not improved  Advice Given:  Genital Hygiene:  Keep your penis and scrotal area clean. Wash this area once daily with un-scented soap and water.  After washing, dry the groin area before the feet (Reason: to prevent spread of tinea pedis to groin area).  Wear cotton underwear (Reason: breathes and keeps area drier). Avoid nylon or tight-fitting underwear.  Expected Course:  The rash should clear up completely in 2-3 weeks.  Call Back If:   Rash is not improving after 1 week on treatment  Fever or pain occurs  You become worse.  Patient Will Follow Care Advice:  YES  Appointment Scheduled:  08/10/2013 08:45:00 Appointment Scheduled Provider:  Maudie Mercury (TEXT 1st, after 20 mins can call), Jarrett Soho [facility])

## 2013-08-10 ENCOUNTER — Ambulatory Visit (INDEPENDENT_AMBULATORY_CARE_PROVIDER_SITE_OTHER): Payer: BC Managed Care – PPO | Admitting: Family Medicine

## 2013-08-10 ENCOUNTER — Telehealth: Payer: Self-pay | Admitting: Internal Medicine

## 2013-08-10 ENCOUNTER — Encounter: Payer: Self-pay | Admitting: Family Medicine

## 2013-08-10 VITALS — BP 110/70 | HR 54 | Temp 97.9°F | Ht 68.5 in | Wt 147.8 lb

## 2013-08-10 DIAGNOSIS — R21 Rash and other nonspecific skin eruption: Secondary | ICD-10-CM

## 2013-08-10 NOTE — Progress Notes (Signed)
No chief complaint on file.   HPI:  Itertrigal skin rash: -reports swimmer with recurrent jock itch and athlete feet -2 weeks ago tx with oral azole for 2 weeks as topical had not resolved issues -reports improved but did not resolve -denies:  ROS: See pertinent positives and negatives per HPI.  Past Medical History  Diagnosis Date  . HSV (herpes simplex virus) infection   . Alcohol abuse, in remission     No past surgical history on file.  Family History  Problem Relation Age of Onset  . Heart disease Mother   . Colitis Mother   . Heart disease Father     cabg    History   Social History  . Marital Status: Divorced    Spouse Name: N/A    Number of Children: N/A  . Years of Education: N/A   Occupational History  . professor [employer]   Social History Main Topics  . Smoking status: Former Smoker    Quit date: 06/23/2000  . Smokeless tobacco: None     Comment: quit in 2000  . Alcohol Use: Yes  . Drug Use: No  . Sexual Activity: Yes   Other Topics Concern  . None   Social History Narrative  . None    Current outpatient prescriptions:buPROPion (WELLBUTRIN XL) 300 MG 24 hr tablet, Take 1 tablet (300 mg total) by mouth every morning., Disp: 90 tablet, Rfl: 3;  clonazePAM (KLONOPIN) 0.5 MG tablet, Take 1 tablet (0.5 mg total) by mouth every 6 (six) hours as needed for anxiety., Disp: 60 tablet, Rfl: 3;  omeprazole (PRILOSEC) 20 MG capsule, Take 20 mg by mouth daily.  , Disp: , Rfl:   EXAM:  Filed Vitals:   08/10/13 0833  BP: 110/70  Pulse: 54  Temp: 97.9 F (36.6 C)    Body mass index is 22.14 kg/(m^2).  GENERAL: vitals reviewed and listed above, alert, oriented, appears well hydrated and in no acute distress  HEENT: atraumatic, conjunttiva clear, no obvious abnormalities on inspection of external nose and ears  NECK: no obvious masses on inspection  SKIN: the finely scaly patchy discoloration has resolved, now with small ares of small erythematous  papules in bilat groin, woods light neg  MS: moves all extremities without noticeable abnormality  PSYCH: pleasant and cooperative, no obvious depression or anxiety  ASSESSMENT AND PLAN:  Discussed the following assessment and plan:  Rash and nonspecific skin eruption  -fungal, candidal, erythrasma or inverse psoriasis unlikely at this point - i do feel he had tinea crusis that resolved, now with likely mild folliculitis -tx with doxy and benzoyl peroxide wash --> he was advised to see dermatology if does not resolve -Patient advised to return or notify a doctor immediately if symptoms worsen or persist or new concerns arise.  Patient Instructions  -As we discussed, we have prescribed a new medication (doxycycline) for you at this appointment. We discussed the common and serious potential adverse effects of this medication and you can review these and more with the pharmacist when you pick up your medication.  Please follow the instructions for use carefully and notify us immediately if you have any problems taking this medication.  -also use once daily benzoyl peroxide wash   -if not resolved in 2 weeks please see a dermatologist      Lucretia Kern

## 2013-08-10 NOTE — Progress Notes (Signed)
Pre visit review using our clinic review tool, if applicable. No additional management support is needed unless otherwise documented below in the visit note. 

## 2013-08-10 NOTE — Patient Instructions (Signed)
-  As we discussed, we have prescribed a new medication (doxycycline) for you at this appointment. We discussed the common and serious potential adverse effects of this medication and you can review these and more with the pharmacist when you pick up your medication.  Please follow the instructions for use carefully and notify us immediately if you have any problems taking this medication.  -also use once daily benzoyl peroxide wash   -if not resolved in 2 weeks please see a dermatologist

## 2013-08-10 NOTE — Telephone Encounter (Signed)
Pt needs the doxycycline you rx this am [facility] [address] Pt went to pu/ not there.

## 2013-08-11 ENCOUNTER — Other Ambulatory Visit: Payer: Self-pay | Admitting: Family Medicine

## 2013-08-11 DIAGNOSIS — R21 Rash and other nonspecific skin eruption: Secondary | ICD-10-CM

## 2013-08-11 MED ORDER — DOXYCYCLINE HYCLATE 100 MG PO TABS: 100.0000 mg | ORAL_TABLET | Freq: Two times a day (BID) | ORAL | Status: DC

## 2013-11-30 ENCOUNTER — Encounter: Payer: Self-pay | Admitting: Gastroenterology

## 2013-12-09 ENCOUNTER — Telehealth: Payer: Self-pay | Admitting: Internal Medicine

## 2013-12-09 MED ORDER — TADALAFIL 5 MG PO TABS: 5.0000 mg | ORAL_TABLET | Freq: Every day | ORAL | Status: DC | PRN

## 2013-12-09 NOTE — Telephone Encounter (Signed)
Caller: [name]/Patient; Phone: [phone]; Reason for Call: Patient reports he was a Patient of Dr.  Arnoldo Morale; has well visit scheduled 12/30/13 with Dr.  Maudie Mercury.  He requests refill on Cialis to last until upcoming appointment.  [facility] [address] [address].  Please follow up with him regarding same.  Thank you.

## 2013-12-09 NOTE — Telephone Encounter (Signed)
rx sent in electronically 

## 2013-12-30 ENCOUNTER — Ambulatory Visit (INDEPENDENT_AMBULATORY_CARE_PROVIDER_SITE_OTHER): Payer: BC Managed Care – PPO | Admitting: Family Medicine

## 2013-12-30 ENCOUNTER — Ambulatory Visit: Payer: BC Managed Care – PPO | Admitting: Internal Medicine

## 2013-12-30 ENCOUNTER — Encounter: Payer: Self-pay | Admitting: Family Medicine

## 2013-12-30 VITALS — BP 118/78 | HR 58 | Temp 97.7°F | Ht 69.25 in | Wt 138.5 lb

## 2013-12-30 DIAGNOSIS — F3160 Bipolar disorder, current episode mixed, unspecified: Secondary | ICD-10-CM

## 2013-12-30 DIAGNOSIS — F509 Eating disorder, unspecified: Secondary | ICD-10-CM

## 2013-12-30 DIAGNOSIS — Z113 Encounter for screening for infections with a predominantly sexual mode of transmission: Secondary | ICD-10-CM

## 2013-12-30 DIAGNOSIS — Z23 Encounter for immunization: Secondary | ICD-10-CM

## 2013-12-30 DIAGNOSIS — Z0000 Encounter for general adult medical examination without abnormal findings: Secondary | ICD-10-CM

## 2013-12-30 DIAGNOSIS — F528 Other sexual dysfunction not due to a substance or known physiological condition: Secondary | ICD-10-CM

## 2013-12-30 DIAGNOSIS — D239 Other benign neoplasm of skin, unspecified: Secondary | ICD-10-CM

## 2013-12-30 DIAGNOSIS — D229 Melanocytic nevi, unspecified: Secondary | ICD-10-CM

## 2013-12-30 DIAGNOSIS — K219 Gastro-esophageal reflux disease without esophagitis: Secondary | ICD-10-CM

## 2013-12-30 LAB — CBC WITH DIFFERENTIAL/PLATELET
BASOS PCT: 0.5 % (ref 0.0–3.0)
Basophils Absolute: 0 10*3/uL (ref 0.0–0.1)
EOS ABS: 0.2 10*3/uL (ref 0.0–0.7)
Eosinophils Relative: 4.6 % (ref 0.0–5.0)
HEMATOCRIT: 43.9 % (ref 39.0–52.0)
HEMOGLOBIN: 14.5 g/dL (ref 13.0–17.0)
LYMPHS ABS: 1.6 10*3/uL (ref 0.7–4.0)
LYMPHS PCT: 34.5 % (ref 12.0–46.0)
MCHC: 33 g/dL (ref 30.0–36.0)
MCV: 90.3 fl (ref 78.0–100.0)
Monocytes Absolute: 0.3 10*3/uL (ref 0.1–1.0)
Monocytes Relative: 7.2 % (ref 3.0–12.0)
NEUTROS ABS: 2.5 10*3/uL (ref 1.4–7.7)
Neutrophils Relative %: 53.2 % (ref 43.0–77.0)
Platelets: 174 10*3/uL (ref 150.0–400.0)
RBC: 4.86 Mil/uL (ref 4.22–5.81)
RDW: 12.9 % (ref 11.5–15.5)
WBC: 4.6 10*3/uL (ref 4.0–10.5)

## 2013-12-30 LAB — BASIC METABOLIC PANEL
BUN: 10 mg/dL (ref 6–23)
CALCIUM: 9.8 mg/dL (ref 8.4–10.5)
CHLORIDE: 102 meq/L (ref 96–112)
CO2: 30 meq/L (ref 19–32)
CREATININE: 1 mg/dL (ref 0.4–1.5)
GFR: 84.91 mL/min (ref >60.00)
Glucose, Bld: 94 mg/dL (ref 70–99)
Potassium: 5 mEq/L (ref 3.5–5.1)
Sodium: 139 mEq/L (ref 135–145)

## 2013-12-30 NOTE — Addendum Note (Signed)
Addended by: Lucretia Kern on: 12/30/2013 09:40 AM   Modules accepted: Level of Service

## 2013-12-30 NOTE — Progress Notes (Signed)
Pre visit review using our clinic review tool, if applicable. No additional management support is needed unless otherwise documented below in the visit note. 

## 2013-12-30 NOTE — Patient Instructions (Signed)
-  please call dermatologist for evaluation of scalp  -We have ordered labs or studies at this visit. It can take up to 1-2 weeks for results and processing. We will contact you with instructions IF your results are abnormal. Normal results will be released to your [facility]. If you have not heard from [place] or can not find your results in [facility] in 2 weeks please contact our office.  -follow up yearly or sooner if needed

## 2013-12-30 NOTE — Progress Notes (Signed)
No chief complaint on file.   HPI:  [name] is here to establish care.  Last PCP and physical:  Has the following chronic problems and concerns today:  Patient Active Problem List   Diagnosis Date Noted  . GERD (gastroesophageal reflux disease) 10/17/2012  . Hyponatremia 10/16/2012  . Insomnia due to mental disorder 06/24/2010  . Bipolar 1 disorder, mixed 06/24/2010  . ERECTILE DYSFUNCTION 08/11/2008   Bipolar Disorder/Eating Disdorder: -for 20 years, does ultra marathons, triathlon -Dr. Creig Hines in psych -sees Johnanna Schneiders counselor, also seeing nutritionist -lives in [place] in the summer -stable currently -denies: weight loss, SI, thoughts of self harm, manic episodes, substance use or alcohol use now -meds are bupropion and clonazepam -on lamictal in the past  GERD: -occasionally takes omeprazole -stable and improved -denies: dysphagia  ED: -aware of risks -takes on occasion -stable  HM: -wants STI testing -doesn onto want PSA after discussion -UTD on colon cancer screening reports normal colonoscopy at age 55 -wants flu vaccine today NON-fasting labs today  ROS negative for unless reported above: fevers, unintentional weight loss, hearing or vision loss, chest pain, palpitations, struggling to breath, hemoptysis, melena, hematochezia, hematuria, falls, loc, si, thoughts of self harm  Past Medical History  Diagnosis Date  . HSV (herpes simplex virus) infection   . Alcohol abuse, in remission   . GERD (gastroesophageal reflux disease)   . ONYCHOMYCOSIS 10/18/2007    Qualifier: Diagnosis of  By: Arnoldo Morale MD, Balinda Quails   . Acute prostatitis 03/17/2008    Qualifier: Diagnosis of  By: Valma Cava LPN, Izora Gala    . 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 Heart disease Mother     CHF  . Colitis Mother   . Heart disease Father 80    cabg    History   Social History  .  Marital Status: Divorced    Spouse Name: N/A    Number of Children: N/A  . Years of Education: N/A   Occupational History  . professor [employer]   Social History Main Topics  . Smoking status: Former Smoker    Quit date: 06/23/2000  . Smokeless tobacco: None     Comment: quit in 2000, smoked on and off for a few year  . Alcohol Use: No  . Drug Use: No  . Sexual Activity: Yes   Other Topics Concern  . 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  , Disp: , Rfl: ;  Probiotic Product (PROBIOTIC DAILY PO), Take by mouth daily., Disp: , Rfl: ;  tadalafil (CIALIS) 5 MG tablet, Take 1 tablet (5 mg total) by mouth daily as needed., Disp: 10  tablet, Rfl: 0  EXAM:  Filed Vitals:   12/30/13 0821  BP: 118/78  Pulse: 58  Temp: 97.7 F (36.5 C)    Body mass index is 20.3 kg/(m^2).  GENERAL: vitals reviewed and listed above, alert, oriented, appears well hydrated and in no acute distress  HEENT: atraumatic, conjunttiva clear, no obvious abnormalities on inspection of external nose and ears  NECK: no obvious masses on inspection  LUNGS: clear to auscultation bilaterally, no wheezes, rales or rhonchi, good air movement  GU: declined  CV: HRRR, no peripheral edema  MS: moves all extremities without noticeable abnormality  PSYCH: pleasant and cooperative, no obvious depression  or anxiety  SKIN: hyperpigmentation in area of prior biopsy on Frontal scalp with irregular mole adjacent  ASSESSMENT AND PLAN:  Discussed the following assessment and plan:  1. Visit for preventive health examination/Establish care with new provider: - Flu Vaccine QUAD 36+ mos PF IM (Fluarix Quad PF) -We reviewed the PMH, PSH, FH, SH, Meds and Allergies. -We provided refills for any medications we will prescribe as needed. -We addressed current concerns per orders and patient instructions. -We have asked for records for pertinent exams, studies, vaccines and notes from previous providers. -We have advised patient to follow up per instructions below. - HIV antibody (with reflex) - RPR -flu vaccine today  3. Eating disorder, unspecified -counseled and supported on continued care with nutritionist and psych, counseling, adequate nutrition - CBC with Differential - Basic metabolic panel  4. Nevus -on scalp, advised he see his dermatologist in the next 1-2 months for evaluation  5. Bipolar 1 disorder, mixed -managed by psych, stable per his report and mood and behavior appropriate at this visit  6. ERECTILE DYSFUNCTION -continue current tx, understands risks  7. Gastroesophageal reflux disease, esophagitis presence not specified improved   -Patient advised to return or notify a doctor immediately if symptoms worsen or persist or new concerns arise.  Patient Instructions  -please call dermatologist for evaluation of scalp  -We have ordered labs or studies at this visit. It can take up to 1-2 weeks for results and processing. We will contact you with instructions IF your results are abnormal. Normal results will be released to your [facility]. If you have not heard from [place] or can not find your results in [facility] in 2 weeks please contact our office.  -follow up yearly or sooner if needed          KIM, [place] R.

## 2013-12-31 LAB — HIV ANTIBODY (ROUTINE TESTING W REFLEX): HIV: NONREACTIVE

## 2013-12-31 LAB — RPR

## 2014-02-22 ENCOUNTER — Telehealth: Payer: Self-pay | Admitting: Family Medicine

## 2014-02-22 MED ORDER — TADALAFIL 5 MG PO TABS: 5.0000 mg | ORAL_TABLET | Freq: Every day | ORAL | Status: DC | PRN

## 2014-02-22 NOTE — Telephone Encounter (Signed)
[id number] [place], [place] is requesting re-fill on tadalafil (CIALIS) 5 MG tablet

## 2014-02-22 NOTE — Telephone Encounter (Signed)
Refill sent.

## 2014-06-14 ENCOUNTER — Other Ambulatory Visit: Payer: Self-pay | Admitting: Family Medicine

## 2014-06-14 NOTE — Telephone Encounter (Signed)
I called the pt and he stated he gets fever blisters once a year and takes Valtrex- one a day for this.  He is aware the Rx was sent to his pharmacy.

## 2014-06-14 NOTE — Telephone Encounter (Signed)
I don't think we discussed this or rxd in the past. Can you please find out if he uses it for outbreaks or suppression, dose, reason so can refill appropriately. Thanks.

## 2014-07-11 ENCOUNTER — Encounter (HOSPITAL_COMMUNITY): Payer: Self-pay | Admitting: Family Medicine

## 2014-07-11 ENCOUNTER — Emergency Department (HOSPITAL_COMMUNITY)
Admission: EM | Admit: 2014-07-11 | Discharge: 2014-07-11 | Disposition: A | Discharge status: Home / Self Care | Payer: BC Managed Care – PPO | Source: Home / Self Care | Attending: Family Medicine | Admitting: Family Medicine

## 2014-07-11 DIAGNOSIS — H6123 Impacted cerumen, bilateral: Secondary | ICD-10-CM

## 2014-07-11 DIAGNOSIS — H6093 Unspecified otitis externa, bilateral: Secondary | ICD-10-CM

## 2014-07-11 MED ORDER — NEOMYCIN-POLYMYXIN-HC 3.5-10000-1 OT SOLN: OTIC | Status: DC

## 2014-07-11 MED ORDER — DOCUSATE SODIUM 50 MG/5ML PO LIQD
ORAL | Status: AC
  Filled 2014-07-11: qty 10

## 2014-07-11 NOTE — ED Notes (Signed)
Ear irrigation completed by Vladimir Faster, emt

## 2014-07-11 NOTE — Discharge Instructions (Signed)
Your ears were clogged with wax. This was cleared in our clinic.  You also have an external ear infection that will require antibiotics to clear.  In the future consider using a 1:1 mixture of water and hydrogen peroxide in your ears to help break up the wax

## 2014-07-11 NOTE — ED Notes (Signed)
Bilateral ear pain, right worse than left ear.  Patient participated in a swimming event in [place] 2 days ago.  Then had 3 airplane flights since then.  Patient arrived back in town last night around midnight.  Unable to sleep last night.

## 2014-07-11 NOTE — ED Provider Notes (Signed)
CSN: [id number]     Arrival date & time 07/11/14  0802 History   None    Chief Complaint  Patient presents with  . Otalgia   (Consider location/radiation/quality/duration/timing/severity/associated sxs/prior Treatment) HPI  BIlat ear pain. Started 2 days ago. Constant and getting worse. R>L. Diminished hearing. Swam race 3 days ago in open water. Pain is getting worse. Unable to sleep last night. Warm compress w/ benefit. OTC ear drops w/o benefit. Denies discharge, fevers, lightheadedness, dizziness, HA, CP, SOB palpitations.   Past Medical History  Diagnosis Date  . HSV (herpes simplex virus) infection   . Alcohol abuse, in remission   . GERD (gastroesophageal reflux disease)   . ONYCHOMYCOSIS 10/18/2007    Qualifier: Diagnosis of  By: Arnoldo Morale MD, Balinda Quails   . Acute prostatitis 03/17/2008    Qualifier: Diagnosis of  By: Valma Cava LPN, Izora Gala    . 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 No pertinent past surgical history. Family History  Problem Relation Age of Onset  . Heart disease Mother     CHF  . Colitis Mother   . Heart disease Father 63    cabg   History  Substance Use Topics  . Smoking status: Former Smoker    Quit date: 06/23/2000  . Smokeless tobacco: Not on file     Comment: quit in 2000, smoked on and off for a few year  . Alcohol Use: No    Review of Systems Per HPI with all other pertinent systems negative.   Allergies  Review of patient's allergies indicates no known allergies.  Home Medications   Prior to Admission medications   Medication Sig Start Date End Date Taking? Authorizing Provider  buPROPion (WELLBUTRIN XL) 300 MG 24 hr tablet Take 1 tablet (300 mg total) by mouth every morning. 12/17/12  Yes Marletta Lor, MD  clonazePAM (KLONOPIN) 0.5 MG tablet Take 1 tablet (0.5 mg total) by mouth every 6 (six) hours as needed for anxiety. 12/17/12  Yes Marletta Lor, MD  Multiple Vitamin  (MULTIVITAMIN) capsule Take 1 capsule by mouth daily.    Historical Provider, MD  omeprazole (PRILOSEC) 20 MG capsule Take 20 mg by mouth daily.      Historical Provider, MD  Probiotic Product (PROBIOTIC DAILY PO) Take by mouth daily.    Historical Provider, MD  tadalafil (CIALIS) 5 MG tablet Take 1 tablet (5 mg total) by mouth daily as needed. 02/22/14   Lucretia Kern, DO  valACYclovir (VALTREX) 500 MG tablet TAKE 1 TABLET BY MOUTH EVERY DAY 06/14/14   Lucretia Kern, DO   BP 138/76 mmHg  Pulse 58  Temp(Src) 98 F (36.7 C) (Oral)  Resp 12 Physical Exam Physical Exam  Constitutional: oriented to person, place, and time. appears well-developed and well-nourished. No distress.  HENT:  R ear canal edematous and TM obscured by cerumen. L TM obscured by cerumen After cleaning ear canals injected adn macerated. More than what would be expected w/ typical ear cleaning. Head: Normocephalic and atraumatic.  Eyes: EOMI. PERRL.  Neck: Normal range of motion.  Cardiovascular: RRR, no m/r/g, 2+ distal pulses,  Pulmonary/Chest: Effort normal and breath sounds normal. No respiratory distress.  Abdominal: Soft. Bowel sounds are normal. NonTTP, no distension.  Musculoskeletal: Normal range of motion. Non ttp, no effusion.  Neurological: alert and oriented to person, place, and time.  Skin: Skin is warm. No rash noted. non diaphoretic.  Psychiatric: normal mood and affect. behavior is normal. Judgment and thought content normal.   ED Course  Procedures (including critical care time) Labs Review Labs Reviewed - No data to display  Imaging Review No results found.   MDM   1. Cerumen impaction, bilateral   2. Otitis externa, bilateral    Ears cleared using hydroperoxide water irrigation. Bilateral otitis media Start Cortisporin NSAIDs for pain and inflammation Precautions given and all questions answered  Linna Darner, MD Family Medicine 07/11/2014, 8:41 AM      Waldemar Dickens,  MD 07/11/14 [phone]

## 2014-07-13 ENCOUNTER — Ambulatory Visit (INDEPENDENT_AMBULATORY_CARE_PROVIDER_SITE_OTHER): Payer: BC Managed Care – PPO | Admitting: Family Medicine

## 2014-07-13 ENCOUNTER — Encounter: Payer: Self-pay | Admitting: Family Medicine

## 2014-07-13 VITALS — BP 110/72 | HR 72 | Temp 98.8°F | Ht 69.25 in | Wt 140.0 lb

## 2014-07-13 DIAGNOSIS — H9203 Otalgia, bilateral: Secondary | ICD-10-CM | POA: Diagnosis not present

## 2014-07-13 MED ORDER — HYDROCODONE-ACETAMINOPHEN 5-325 MG PO TABS: 1.0000 | ORAL_TABLET | Freq: Four times a day (QID) | ORAL | Status: DC | PRN

## 2014-07-13 MED ORDER — CEFTRIAXONE SODIUM 1 G IJ SOLR
1.0000 g | Freq: Once | INTRAMUSCULAR | Status: AC
  Administered 2014-07-13: 1 g via INTRAMUSCULAR

## 2014-07-13 NOTE — Patient Instructions (Addendum)
Appointment with Dr. Lucia Gaskins at 2:00 pm tomorrow.   I am concerned due to the severity of your pain and symptoms of both an ear infection and outer ear infection.   We gave you a strong antibiotic ceftriaxone 2g which will last at least 24 hours.   I am giving you a strong pain medicine. Do not drive for 8 hours after taking this. Keep taking the ibuprofen. You can take these pain medications together.

## 2014-07-13 NOTE — Progress Notes (Signed)
Pre visit review using our clinic review tool, if applicable. No additional management support is needed unless otherwise documented below in the visit note. 

## 2014-07-13 NOTE — Progress Notes (Signed)
Garret Reddish, MD Phone: [phone]  Subjective:   PASCAL STIGGERS is a 59 y.o. year old very pleasant male patient who presents with the following:  Bilateral ear pain R>L  From urgent care note  07/11/14 HPI "BIlat ear pain. Started 2 days ago. Constant and getting worse. R>L. Diminished hearing. Swam race 3 days ago in open water. Pain is getting worse. Unable to sleep last night. Warm compress w/ benefit. OTC ear drops w/o benefit. Denies discharge, fevers, lightheadedness, dizziness, HA, CP, SOB palpitations. " From A/P " 1. Cerumen impaction, bilateral   2. Otitis externa, bilateral    Ears cleared using hydroperoxide water irrigation. Bilateral otitis media Start Cortisporin NSAIDs for pain and inflammation      "  Today, patient reports, Pain 8/10 before irrigation, improved immediately afterwards then worsened in next few hours.  Pain now 10/10 aching or throbbing as well as sharp pains. Headache bilateral temples. He has not slept in the last 3 days. He has used cortisporin Taking drops 4x a dayand nsaids and pain has not improved at all. Hearing greatly diminished.Tinnitus- ticking of a clock sound and could be either ear. Pain stable over last 2 days but severe.   ROS- no cough, congestion. Some nausea and decreased appetite. Denies fevers. Mild sore throat.   Past Medical History- Patient Active Problem List   Diagnosis Date Noted  . GERD (gastroesophageal reflux disease) 10/17/2012  . Hyponatremia 10/16/2012  . Insomnia due to mental disorder 06/24/2010  . Bipolar 1 disorder, mixed 06/24/2010  . ERECTILE DYSFUNCTION 08/11/2008   Medications- reviewed and updated Current Outpatient Prescriptions  Medication Sig Dispense Refill  . buPROPion (WELLBUTRIN XL) 300 MG 24 hr tablet Take 1 tablet (300 mg total) by mouth every morning. 90 tablet 3  . clonazePAM (KLONOPIN) 0.5 MG tablet Take 1 tablet (0.5 mg total) by mouth every 6 (six) hours as needed for anxiety.  60 tablet 3  . Multiple Vitamin (MULTIVITAMIN) capsule Take 1 capsule by mouth daily.    Marland Kitchen neomycin-polymyxin-hydrocortisone (CORTISPORIN) otic solution 3-4 Drops, 4 times per day for 7 days 10 mL 0  . omeprazole (PRILOSEC) 20 MG capsule Take 20 mg by mouth daily.      . Probiotic Product (PROBIOTIC DAILY PO) Take by mouth daily.    . tadalafil (CIALIS) 5 MG tablet Take 1 tablet (5 mg total) by mouth daily as needed. 10 tablet 2  . valACYclovir (VALTREX) 500 MG tablet TAKE 1 TABLET BY MOUTH EVERY DAY 30 tablet 0   Objective: BP 110/72 mmHg  Pulse 72  Temp(Src) 98.8 F (37.1 C) (Oral)  Ht 5' 9.25" (1.759 m)  Wt 140 lb (63.504 kg)  BMI 20.52 kg/m2 Gen: appears fatigued, somewhat shaky Oropharynx normal. Nasal turbinates not swollen or erythematous, no drainage.   Patient with pain with pressure on tragus and pulling helix R >L External ear canal on R mildly erythematous, more pronounced erythema on left R TM with white opacification and overlying erythema L TM with some opacification without obvious erythema  CV: RRR no murmurs rubs or gallops Lungs: CTAB no crackles, wheeze, rhonchi Ext: no edema Skin: warm, dry, no rash or erythema or swelling external ear  Hearing reduced, asks me to come close to speak and into ear   Assessment/Plan:  Bilateral ear pain R>L Being treated for external otitis media with no improvement. Has clinical features and exam concerning for external otitits but pain level is far above what I would suspect for someone  being appropriately treated. His R ear drum has the appearance of acute otitis media. Consulted with Dr. Maudie Mercury given severity of pain and we will refer to ENT. Tried to get in today but availability tomorrow. Will treat with ceftriaxone 2g for potential bacterial otitis media. Also gave limited pain medication supply to get him to appointment tomorrow. Does not have appearance but I am concerned for malignant otitis externa or fungal infection that  has not been responsive to current treatments. Continue cortisporin for now.   Return precautions advised.   Meds ordered this encounter  Medications  . HYDROcodone-acetaminophen (NORCO/VICODIN) 5-325 MG per tablet    Sig: Take 1 tablet by mouth every 6 (six) hours as needed.    Dispense:  10 tablet    Refill:  0  . cefTRIAXone (ROCEPHIN) injection 1 g    Sig:     Order Specific Question:  Antibiotic Indication:    Answer:  Other Indication (list below)    Order Specific Question:  Other Indication:    Answer:  ear pain

## 2014-07-14 ENCOUNTER — Telehealth: Payer: Self-pay | Admitting: Family Medicine

## 2014-07-14 NOTE — Telephone Encounter (Signed)
Checked in on patient. Able to get some rest last night finally but pain is still severe as pain medication wears off. He has his ENT appointment soon. Look forward to seeing Dr. Pollie Friar report

## 2014-07-17 ENCOUNTER — Telehealth: Payer: Self-pay | Admitting: Family Medicine

## 2014-07-17 NOTE — Telephone Encounter (Signed)
Ok to refill 

## 2014-07-17 NOTE — Telephone Encounter (Signed)
Pt said the specialist gave him an antibiotic and he said he is still having the pain and is requesting a refill on the below medicine    Pt request refill of the following: HYDROcodone-acetaminophen (NORCO/VICODIN) 5-325 MG per tablet   Phamacy:

## 2014-07-17 NOTE — Telephone Encounter (Signed)
Pt went to a specialist and does not need the med anymore. Got it from another dr

## 2014-09-12 ENCOUNTER — Other Ambulatory Visit: Payer: Self-pay | Admitting: *Deleted

## 2014-09-12 MED ORDER — VALACYCLOVIR HCL 500 MG PO TABS: 500.0000 mg | ORAL_TABLET | Freq: Every day | ORAL | Status: DC

## 2014-09-12 NOTE — Telephone Encounter (Signed)
Rx done. 

## 2014-11-07 ENCOUNTER — Other Ambulatory Visit: Payer: Self-pay | Admitting: Family Medicine

## 2014-11-08 ENCOUNTER — Telehealth: Payer: Self-pay | Admitting: Family Medicine

## 2014-11-08 NOTE — Telephone Encounter (Signed)
FYI  I schedule this pt for a phy on 01/22/15. He call complaining that he has been trying to get a phy for over a year.

## 2014-11-09 NOTE — Telephone Encounter (Signed)
Had preventive visit with new pt visit 12/2013, so not due until October. Not sure I understand complaint as insurance will not usually pay for more then 1 per year.

## 2014-11-10 ENCOUNTER — Other Ambulatory Visit: Payer: Self-pay | Admitting: *Deleted

## 2014-11-10 MED ORDER — TADALAFIL 5 MG PO TABS: 5.0000 mg | ORAL_TABLET | Freq: Every day | ORAL | Status: DC | PRN

## 2014-11-10 NOTE — Telephone Encounter (Signed)
I called the pt and he stated he was told by Dr Maudie Mercury at his visit in September of last year that this was a meet and greet only and he did not have a physical. Patient states if Dr Maudie Mercury does not want to see him for a physical he can schedule with someone else.

## 2014-11-10 NOTE — Telephone Encounter (Signed)
Sorry Michelle-could you please call this patient and relay the message below? He seemed upset and I do not feel comfortable calling him back?

## 2014-11-10 NOTE — Telephone Encounter (Signed)
PA request was received and submitted to [facility].  Case ID# [id number].

## 2014-11-10 NOTE — Telephone Encounter (Signed)
Patient has physical scheduled in October with Dr. Maudie Mercury. I am unsure what is needed from me

## 2014-11-10 NOTE — Telephone Encounter (Signed)
Please ensure he knows: Happy to do his yearly preventive visit as scheduled and will plan on this unless he wishes to see someone else.    Note: There must have been a misunderstanding at his establish care visit. We don't usually do preventive physical visits the same day as new pt visits as there usually is not time, and he was likely told this. However, from looking at his chart, he wanted an annual exam, so we covered the preventive care information and did labs that day and considered it a preventive (annual) care visit visit.

## 2014-11-10 NOTE — Telephone Encounter (Signed)
Spoke with patient. Patient and self clarified waiting for physical appointment in October. Patient verbalized understanding. Patient requesting refill on CIALIS to get him to his appointment in October. Dr. Maudie Mercury approved. Rx sent in.

## 2015-01-22 ENCOUNTER — Encounter: Payer: Self-pay | Admitting: Family Medicine

## 2015-01-22 ENCOUNTER — Ambulatory Visit (INDEPENDENT_AMBULATORY_CARE_PROVIDER_SITE_OTHER): Payer: BC Managed Care – PPO | Admitting: Family Medicine

## 2015-01-22 VITALS — BP 100/72 | HR 51 | Temp 97.5°F | Ht 69.0 in | Wt 140.9 lb

## 2015-01-22 DIAGNOSIS — Z1159 Encounter for screening for other viral diseases: Secondary | ICD-10-CM

## 2015-01-22 DIAGNOSIS — H53132 Sudden visual loss, left eye: Secondary | ICD-10-CM | POA: Diagnosis not present

## 2015-01-22 DIAGNOSIS — Z299 Encounter for prophylactic measures, unspecified: Secondary | ICD-10-CM | POA: Diagnosis not present

## 2015-01-22 DIAGNOSIS — Z23 Encounter for immunization: Secondary | ICD-10-CM | POA: Diagnosis not present

## 2015-01-22 DIAGNOSIS — K219 Gastro-esophageal reflux disease without esophagitis: Secondary | ICD-10-CM | POA: Diagnosis not present

## 2015-01-22 DIAGNOSIS — F528 Other sexual dysfunction not due to a substance or known physiological condition: Secondary | ICD-10-CM

## 2015-01-22 DIAGNOSIS — Z0000 Encounter for general adult medical examination without abnormal findings: Secondary | ICD-10-CM

## 2015-01-22 DIAGNOSIS — Z113 Encounter for screening for infections with a predominantly sexual mode of transmission: Secondary | ICD-10-CM

## 2015-01-22 DIAGNOSIS — F3160 Bipolar disorder, current episode mixed, unspecified: Secondary | ICD-10-CM

## 2015-01-22 LAB — BASIC METABOLIC PANEL
BUN: 9 mg/dL (ref 6–23)
CO2: 34 mEq/L — ABNORMAL HIGH (ref 19–32)
Calcium: 9.7 mg/dL (ref 8.4–10.5)
Chloride: 102 mEq/L (ref 96–112)
Creatinine, Ser: 0.83 mg/dL (ref 0.40–1.50)
GFR: 101.25 mL/min (ref >60.00)
Glucose, Bld: 87 mg/dL (ref 70–99)
POTASSIUM: 4.3 meq/L (ref 3.5–5.1)
SODIUM: 140 meq/L (ref 135–145)

## 2015-01-22 LAB — LIPID PANEL
CHOL/HDL RATIO: 3
Cholesterol: 154 mg/dL (ref 0–200)
HDL: 58.9 mg/dL (ref >39.00)
LDL Cholesterol: 84 mg/dL (ref 0–99)
NONHDL: 94.79
Triglycerides: 56 mg/dL (ref 0.0–149.0)
VLDL: 11.2 mg/dL (ref 0.0–40.0)

## 2015-01-22 LAB — HEMOGLOBIN A1C: HEMOGLOBIN A1C: 5.3 % (ref 4.6–6.5)

## 2015-01-22 LAB — TSH: TSH: 1.07 u[IU]/mL (ref 0.35–4.50)

## 2015-01-22 NOTE — Progress Notes (Signed)
Pre visit review using our clinic review tool, if applicable. No additional management support is needed unless otherwise documented below in the visit note. 

## 2015-01-22 NOTE — Patient Instructions (Signed)
BEFORE YOU LEAVE: -labs -flu vaccine -eye doctor appointment -follow up in 3 months  Please call for an appointment with the dermatologist to look at your leg  Please see the eye doctor as scheduled and I would advise imaging of the brain as well - please call if you need for me to order this.  We recommend the following healthy lifestyle measures: - eat a healthy whole foods diet consisting of regular small meals composed of vegetables, fruits, beans, nuts, seeds, healthy meats such as white chicken and fish and whole grains.  - avoid sweets, white starchy foods, fried foods, fast food, processed foods, sodas, red meet and other fattening foods.  - get a least 150-300 minutes of aerobic exercise per week.   -We have ordered labs or studies at this visit. It can take up to 1-2 weeks for results and processing. We will contact you with instructions IF your results are abnormal. Normal results will be released to your [facility]. If you have not heard from [place] or can not find your results in [facility] in 2 weeks please contact our office.

## 2015-01-22 NOTE — Progress Notes (Signed)
HPI:  Here for CPE:  -Concerns and/or follow up today:   STI screening: -new sexual partner, male -for last 2-3 months -wants STI testing for HIC, RPR, GC/Chlam  Visual disturbance: -reports first occurred 6 months ago -4-5 episodes of "floaters" and "pinpoint" loss of vision in the center of his L eye lasting "30 minutes" then with complete resolution -last episode about 1 week ago -denies any precipitating factors or other symptoms -denies: HA, persistent vision loss, malaise, fevers, double vision, drooping eyelid, weakness, numbness, speech issues with attacks  Skin lesion L lower leg: -for many years, reports had dermatology eval for this in the past and told benign -report seems to slowly enlarge  ED: -requests refill on cialis, uses rarely  -Diet: variety of foods, balance and well rounded  -Exercise: regular exercise  -Diabetes and Dyslipidemia Screening:today, FASTING  -Hx of HTN: no  -Vaccines: flu today  -sexual activity: yes, male partner  -wants STI testing, Hep C screening (if born 54-1963): yes  -FH colon or prostate ca: see FH Last colon cancer screening: done and normal pre pt report Last prostate ca screening: declined after discussion benefits and risks  -Alcohol, Tobacco, drug use: see social history  Review of Systems - no fevers, unintentional weight loss, hearing loss, chest pain, sob, hemoptysis, melena, hematochezia, hematuria, genital discharge, bleeding, bruising, loc, thoughts of self harm or SI  Past Medical History  Diagnosis Date  . HSV (herpes simplex virus) infection   . Alcohol abuse, in remission   . GERD (gastroesophageal reflux disease)   . ONYCHOMYCOSIS 10/18/2007    Qualifier: Diagnosis of  By: Arnoldo Morale MD, Balinda Quails   . Acute prostatitis 03/17/2008    Qualifier: Diagnosis of  By: Valma Cava LPN, Izora Gala    . 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  Family  History  Problem Relation Age of Onset  . Heart disease Mother     CHF  . Colitis Mother   . Heart disease Father 26    cabg    Social History   Social History  . Marital Status: Divorced    Spouse Name: N/A  . Number of Children: N/A  . Years of Education: N/A   Occupational History  . professor [employer]   Social History Main Topics  . Smoking status: Former Smoker    Quit date: 06/23/2000  . Smokeless tobacco: None     Comment: quit in 2000, smoked on and off for a few year  . Alcohol Use: No  . Drug Use: No  . Sexual Activity: Yes   Other Topics Concern  . 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  buPROPion (WELLBUTRIN XL) 300 MG 24 hr tablet, Take 1 tablet (300 mg total) by mouth every morning., Disp: 90 tablet, Rfl: 3 .  clonazePAM (KLONOPIN) 0.5 MG tablet, Take 1 tablet (0.5 mg total) by mouth every 6 (six) hours as needed for anxiety., Disp: 60 tablet, Rfl: 3 .  Multiple Vitamin (MULTIVITAMIN) capsule, Take 1 capsule by mouth daily., Disp: , Rfl:  .  omeprazole (PRILOSEC) 20 MG capsule, Take 20  mg by mouth daily.  , Disp: , Rfl:  .  tadalafil (CIALIS) 5 MG tablet, Take 1 tablet (5 mg total) by mouth daily as needed for erectile dysfunction., Disp: 30 tablet, Rfl: 1 .  valACYclovir (VALTREX) 500 MG tablet, Take 1 tablet (500 mg total) by mouth daily., Disp: 30 tablet, Rfl: 4  EXAM:  Filed Vitals:   01/22/15 0915  BP: 100/72  Pulse: 51  Temp: 97.5 F (36.4 C)  TempSrc: Oral  Height: 5\' 9"  (1.753 m)  Weight: 140 lb 14.4 oz (63.912 kg)    Estimated body mass index is 20.8 kg/(m^2) as calculated from the following:   Height as of this encounter: 5\' 9"  (1.753 m).   Weight as of this encounter: 140 lb 14.4 oz (63.912 kg).  GENERAL: vitals reviewed and  listed below, alert, oriented, appears well hydrated and in no acute distress  HEENT: head atraumatic, PERRLA, normal appearance of eyes, ears, nose and mouth. moist mucus membranes.  NECK: supple, no masses or lymphadenopathy  LUNGS: clear to auscultation bilaterally, no rales, rhonchi or wheeze  CV: HRRR, no peripheral edema or cyanosis, normal pedal pulses  ABDOMEN: bowel sounds normal, soft, non tender to palpation, no masses, no rebound or guarding  GU: declined  SKIN: no rash or abnormal lesions except for 2x1.5 cm irr shaped stuck on appearing light brown plaque on L lower outer leg  MS: normal gait, moves all extremities normally  NEURO: CN II-XII grossly intact, normal muscle strength and sensation to light touch on extremities  PSYCH: normal affect, pleasant and cooperative  ASSESSMENT AND PLAN:  Discussed the following assessment and plan:  Encounter for preventive measure - Plan: Lipid panel, Hemoglobin A1c, TSH, Basic Metabolic Panel  Vision, loss, sudden, left - Plan: Ambulatory referral to Ophthalmology -we discussed possible serious and likely etiologies, workup and treatment, treatment risks and return precautions - including but not limited to floaters, stress, masses, retinal detachment, vascular compromise, stroke, mass, neurodegenerative disorders, other -advised urgent optho exam and appt scheduled and details provided by scheduling staff -also advise imaging of the brain - he declined and decided to do eye exam first as he feels is an eye issues Gastroesophageal reflux disease, esophagitis presence not specified  Bipolar 1 disorder, mixed ([place]) -sees psych, reports stable  ERECTILE DYSFUNCTION -risks discussed, refilled  Routine screening for STI (sexually transmitted infection) - Plan: HIV antibody (with reflex), RPR, GC/Chlamydia Amp Probe, Urine  Need for hepatitis C screening test - Plan: Hep C Antibody  Encounter for immunization  Advised  dermatology re-eval of skin lesion on leg and number given to call  -Discussed and advised all [place] preventive services health task force level A and B recommendations for age, sex and risks.  -Advised at least 150 minutes of exercise per week and a healthy diet low in saturated fats and sweets and consisting of fresh fruits and vegetables, lean meats such as fish and white chicken and whole grains.  -labs, studies and vaccines per orders this encounter   Patient advised to return to clinic immediately if symptoms worsen or persist or new concerns.  Patient Instructions  BEFORE YOU LEAVE: -labs -flu vaccine -eye doctor appointment -follow up in 3 months  Please call for an appointment with the dermatologist to look at your leg  Please see the eye doctor as scheduled and I would advise imaging of the brain as well - please call if you need for me to order this.  We recommend the  following healthy lifestyle measures: - eat a healthy whole foods diet consisting of regular small meals composed of vegetables, fruits, beans, nuts, seeds, healthy meats such as white chicken and fish and whole grains.  - avoid sweets, white starchy foods, fried foods, fast food, processed foods, sodas, red meet and other fattening foods.  - get a least 150-300 minutes of aerobic exercise per week.   -We have ordered labs or studies at this visit. It can take up to 1-2 weeks for results and processing. We will contact you with instructions IF your results are abnormal. Normal results will be released to your [facility]. If you have not heard from [place] or can not find your results in [facility] in 2 weeks please contact our office.           No Follow-up on file.   Wayne Benton R.

## 2015-01-23 LAB — GC/CHLAMYDIA PROBE AMP, URINE
Chlamydia, Swab/Urine, PCR: NEGATIVE
GC PROBE AMP, URINE: NEGATIVE

## 2015-01-23 LAB — HIV ANTIBODY (ROUTINE TESTING W REFLEX): HIV 1&2 Ab, 4th Generation: NONREACTIVE

## 2015-01-23 LAB — RPR

## 2015-01-23 LAB — HEPATITIS C ANTIBODY: HCV Ab: NEGATIVE

## 2015-01-24 IMAGING — CR DG CHEST 2V
2 series · 2 of 2 positions shown · non-contrast
Comparison: None

CLINICAL DATA: Chest pain

CHEST - 2 VIEW

[w chest pa]
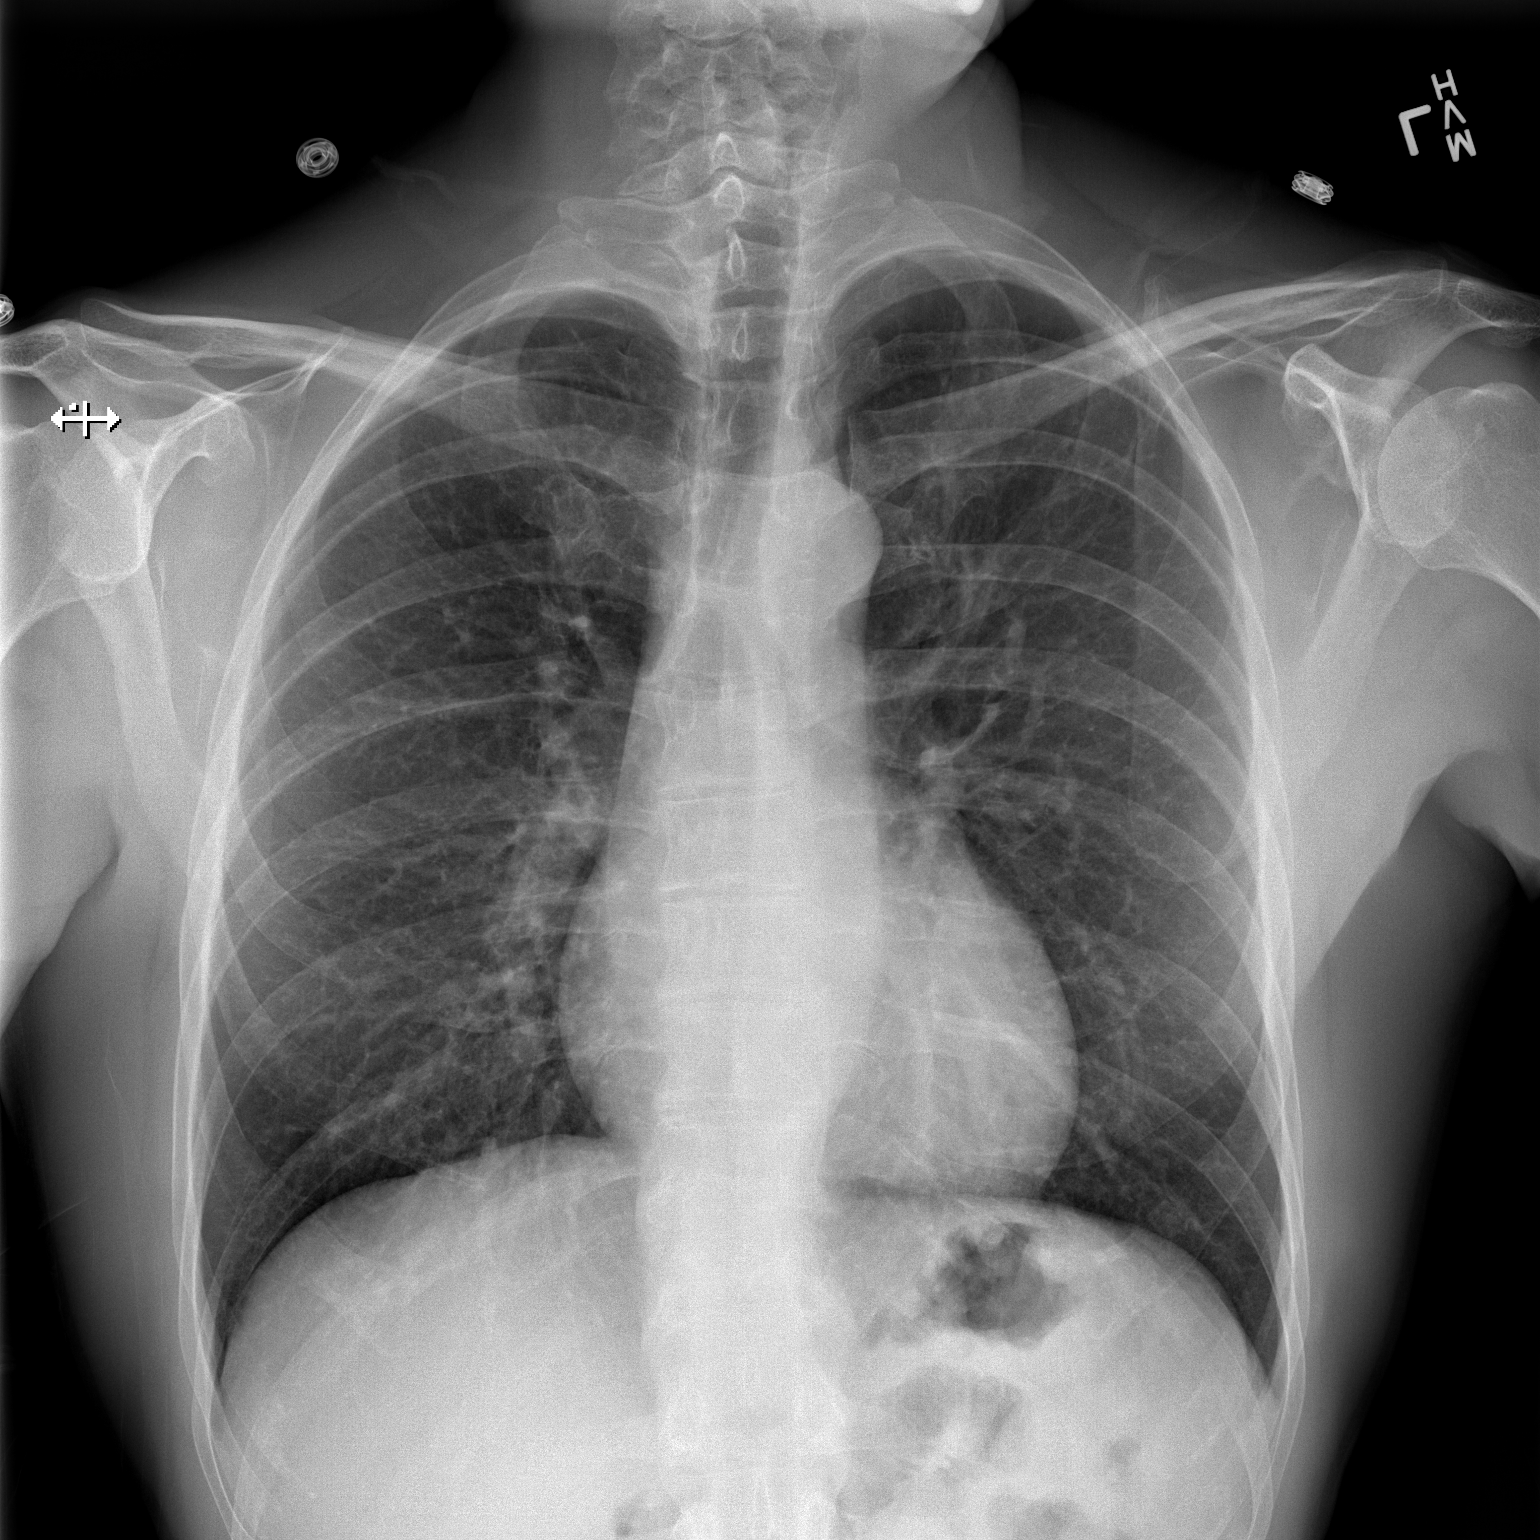

[w chest lat]
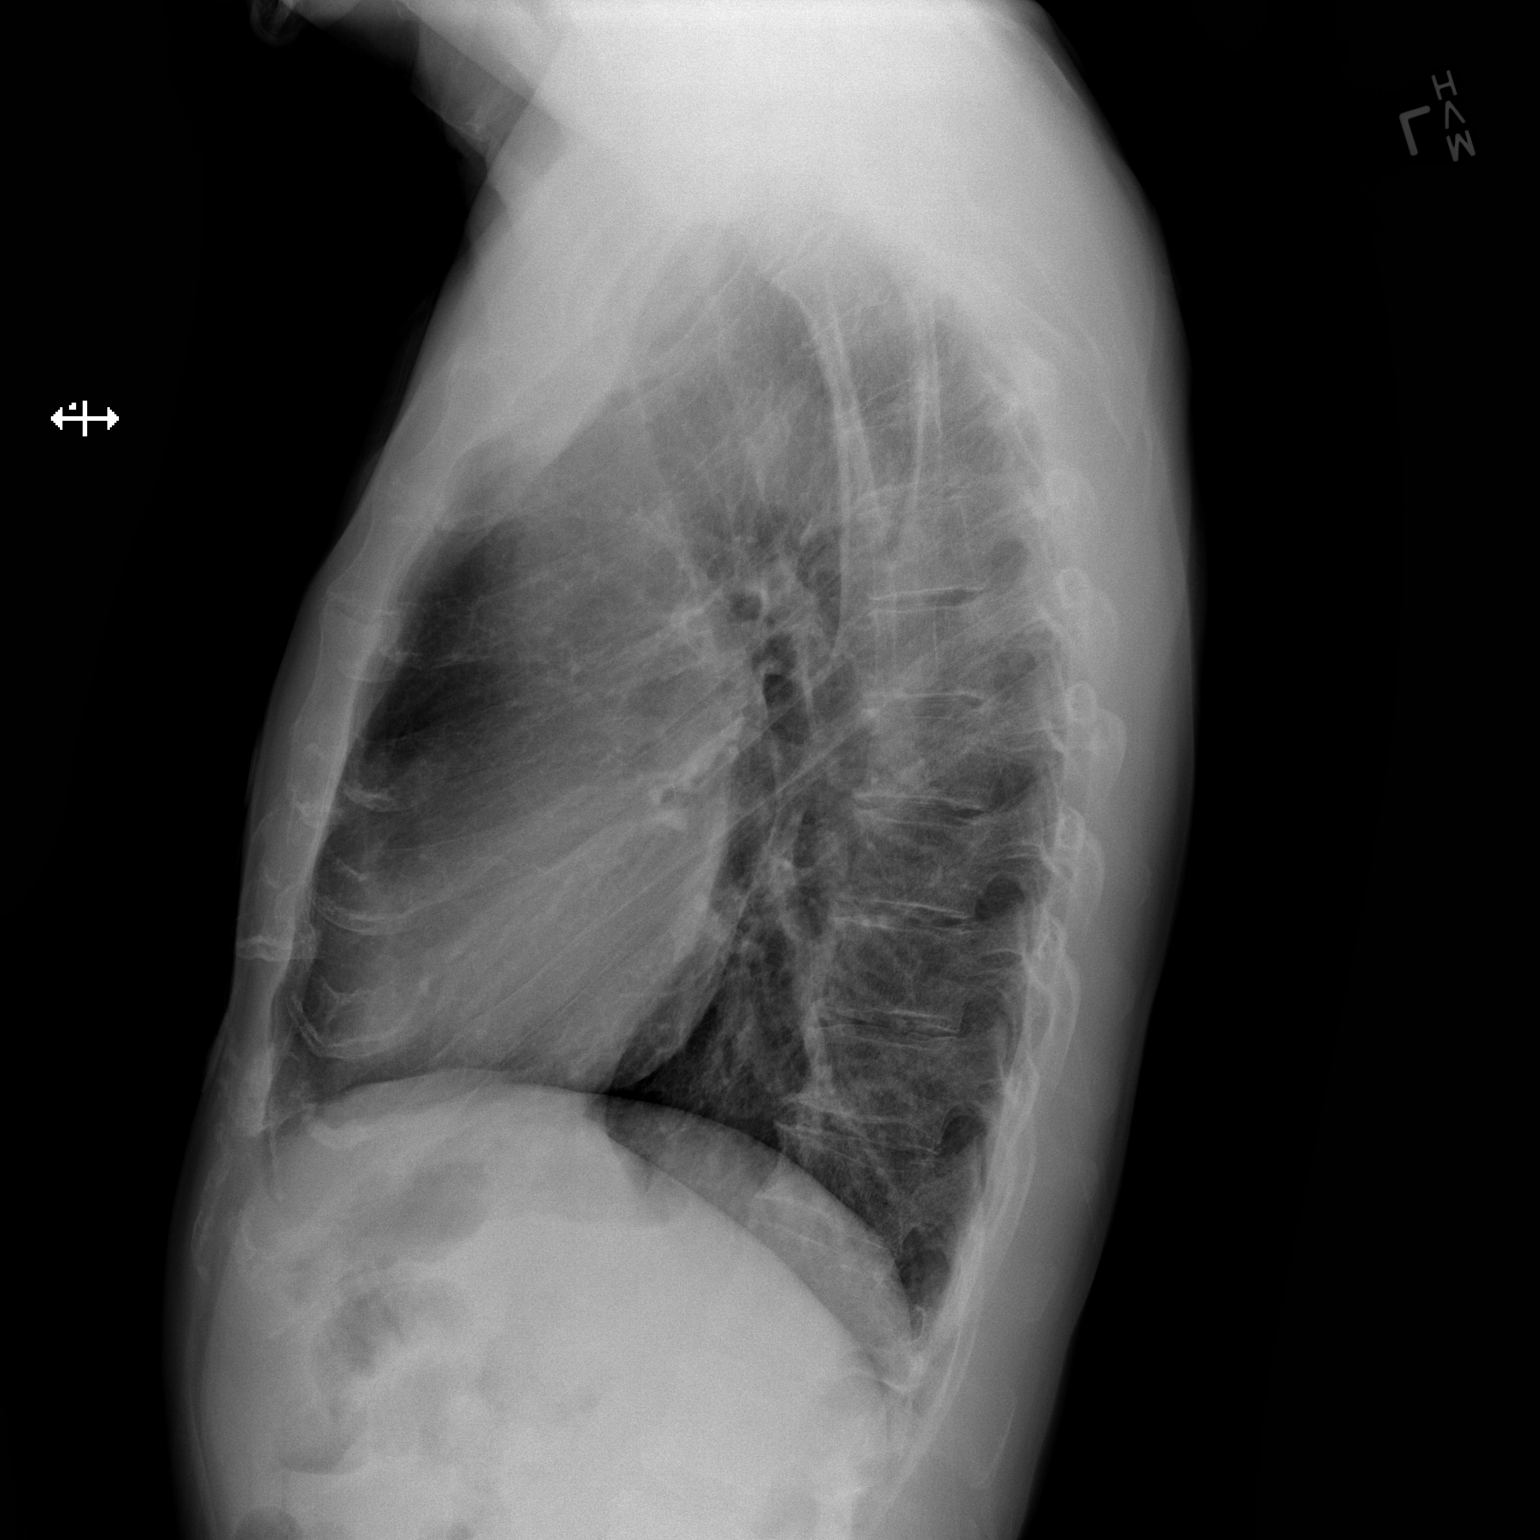

[2 of 2 positions shown; findings below may reference images not displayed]

FINDINGS: The heart size and mediastinal contours are within normal
limits.  Both lungs are clear.  The visualized skeletal structures
are remarkable for mild multilevel thoracic spondylosis..
IMPRESSION: Negative exam.

## 2015-02-21 ENCOUNTER — Other Ambulatory Visit: Payer: Self-pay | Admitting: Family Medicine

## 2015-02-26 ENCOUNTER — Encounter: Payer: Self-pay | Admitting: Gastroenterology

## 2015-04-24 ENCOUNTER — Ambulatory Visit (INDEPENDENT_AMBULATORY_CARE_PROVIDER_SITE_OTHER): Payer: BC Managed Care – PPO | Admitting: Family Medicine

## 2015-04-24 ENCOUNTER — Ambulatory Visit: Payer: BC Managed Care – PPO | Admitting: Family Medicine

## 2015-04-24 ENCOUNTER — Encounter: Payer: Self-pay | Admitting: Family Medicine

## 2015-04-24 VITALS — BP 118/84 | HR 53 | Temp 97.3°F | Ht 69.0 in | Wt 140.5 lb

## 2015-04-24 DIAGNOSIS — G43109 Migraine with aura, not intractable, without status migrainosus: Secondary | ICD-10-CM | POA: Insufficient documentation

## 2015-04-24 DIAGNOSIS — F3160 Bipolar disorder, current episode mixed, unspecified: Secondary | ICD-10-CM

## 2015-04-24 DIAGNOSIS — Z8619 Personal history of other infectious and parasitic diseases: Secondary | ICD-10-CM | POA: Diagnosis not present

## 2015-04-24 DIAGNOSIS — G43809 Other migraine, not intractable, without status migrainosus: Secondary | ICD-10-CM

## 2015-04-24 NOTE — Progress Notes (Signed)
HPI:  [name] is a very pleasant 60 yo whom spends most of his time in [place] and is followed by a psychiatrist there here for follow up today. He had a remote brief visual disturbance that he reported at his last visit - since then he had optho eval and was told he had an ocular migraine. Has had no other symptoms since and plans to follow up for recheck with optho as he was advised. 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 Now wants to do episodic treatment. He wants my thoughts on chronic benzo use - he uses them for anxiety and sleep - rxed by his psychiatrist but he is anxious about side effects. He reports he will be in [place] for one year and will plan a physical when he returns and will do his colonoscopy then.  ROS: See pertinent positives and negatives per HPI.  Past Medical History  Diagnosis Date  . HSV (herpes simplex virus) infection   . Alcohol abuse, in remission   . GERD (gastroesophageal reflux disease)   . ONYCHOMYCOSIS 10/18/2007    Qualifier: Diagnosis of  By: Arnoldo Morale MD, Balinda Quails   . Acute prostatitis 03/17/2008    Qualifier: Diagnosis of  By: Valma Cava LPN, Izora Gala    . 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  Family History  Problem Relation Age of Onset  . Heart disease Mother     CHF  . Colitis Mother   . Heart disease Father 40    cabg    Social History   Social History  . Marital Status: Divorced    Spouse Name: N/A  . Number of Children: N/A  . Years of Education: N/A   Occupational History  . professor [employer]   Social History Main Topics  . Smoking status: Former Smoker    Quit date: 06/23/2000  . Smokeless tobacco: None     Comment: quit in 2000, smoked on and off for a few year  . Alcohol Use: No  . Drug Use: No  . Sexual Activity: Yes   Other Topics Concern  . 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  buPROPion (WELLBUTRIN XL) 300 MG 24 hr tablet, Take 1 tablet (300 mg total) by mouth every morning., Disp: 90 tablet, Rfl: 3 .  clonazePAM (KLONOPIN) 0.5 MG tablet, Take 1 tablet (0.5 mg total) by mouth every 6 (six) hours as needed for anxiety., Disp: 60 tablet, Rfl: 3 .  Multiple Vitamin (MULTIVITAMIN) capsule, Take 1 capsule by mouth daily., Disp: , Rfl:  .  tadalafil (CIALIS) 5 MG tablet, Take 1 tablet (5 mg total) by mouth daily as needed for erectile dysfunction., Disp: 30 tablet, Rfl: 1 .  valACYclovir (VALTREX) 500 MG tablet, TAKE 1 TABLET BY MOUTH EVERY DAY, Disp: 30 tablet, Rfl: 2  EXAM:  Filed Vitals:   04/24/15 1052  BP: 118/84  Pulse: 53  Temp: 97.3 F (36.3 C)    Body mass index is 20.74 kg/(m^2).  GENERAL: vitals reviewed and listed above, alert, oriented, appears well hydrated and in  no acute distress  HEENT: atraumatic, conjunttiva clear, no obvious abnormalities on inspection of external nose and ears  NECK: no obvious masses on inspection  LUNGS: clear to auscultation bilaterally, no wheezes, rales or rhonchi, good air movement  CV: HRRR, no peripheral edema  MS: moves all extremities without noticeable abnormality  PSYCH: pleasant and cooperative, no obvious depression or anxiety  ASSESSMENT AND PLAN:  Discussed the following assessment and plan:  Ocular migraine -f/u with optho, again discussed may need imaging if recurrent   Bipolar 1 disorder, mixed ([place]) -sees psych -discussed risks/die effect benzos, advise to work with psych to taper off if feels he does not need this medication  H/O cold sores -discussed suppressive vs episodic dosing and he plans to try episodic tx for now  -Patient advised to return or notify a doctor immediately if symptoms worsen or persist or new  concerns arise.  There are no Patient Instructions on file for this visit.   Colin Benton R.

## 2015-04-24 NOTE — Progress Notes (Signed)
Pre visit review using our clinic review tool, if applicable. No additional management support is needed unless otherwise documented below in the visit note. 

## 2015-07-11 ENCOUNTER — Other Ambulatory Visit: Payer: Self-pay | Admitting: Family Medicine

## 2015-11-29 ENCOUNTER — Other Ambulatory Visit: Payer: Self-pay | Admitting: Family Medicine

## 2015-12-10 ENCOUNTER — Telehealth: Payer: Self-pay | Admitting: Family Medicine

## 2015-12-10 NOTE — Telephone Encounter (Signed)
PA for Cialis 5 mg tabs submitted to CoverMyMeds.  Waiting on a response.

## 2015-12-12 ENCOUNTER — Telehealth: Payer: Self-pay

## 2015-12-12 NOTE — Telephone Encounter (Signed)
PA for Cialis denied because insurance does not allow coverage unless it is being prescribed for daily use for symptomatic benign prostatic hyperplasia in a patient that is 58 years of age or older.

## 2015-12-13 MED ORDER — TADALAFIL 5 MG PO TABS: 5.0000 mg | ORAL_TABLET | Freq: Every day | ORAL | 5 refills | Status: DC | PRN

## 2015-12-13 NOTE — Telephone Encounter (Signed)
Ok to send per pt request.

## 2015-12-13 NOTE — Telephone Encounter (Signed)
Called and spoke with pt.Pt is aware that  Cialis is not covered by insurance. Pt is requesting a prescription be called in for 5 tabs with 5 refills so that he can afford to pay out of pocket.

## 2015-12-13 NOTE — Addendum Note (Signed)
Addended by: Agnes Lawrence on: 12/13/2015 01:57 PM   Modules accepted: Orders

## 2015-12-13 NOTE — Telephone Encounter (Signed)
Please let pt know. Please have him contact his insurance to see what options they do cover and let us know if he wishes to change rx. Thanks.

## 2015-12-13 NOTE — Telephone Encounter (Signed)
Rx done. 

## 2016-04-23 ENCOUNTER — Telehealth: Payer: Self-pay | Admitting: Family Medicine

## 2016-04-23 NOTE — Telephone Encounter (Signed)
Pt is out of town and is asking for an Rx for valACYclovir (VALTREX) 500 MG tablet to be sent [facility] [address] in [place], [place] [address].  The phone for the pharmacy is [phone].  Pt states he currently has a fever blister and it won't go away unless he uses this medication ASAP.

## 2016-04-24 MED ORDER — VALACYCLOVIR HCL 500 MG PO TABS: 500.0000 mg | ORAL_TABLET | Freq: Every day | ORAL | 0 refills | Status: DC

## 2016-04-24 NOTE — Telephone Encounter (Signed)
Rx done. 

## 2016-04-24 NOTE — Addendum Note (Signed)
Addended by: Agnes Lawrence on: 04/24/2016 07:21 AM   Modules accepted: Orders

## 2016-04-28 NOTE — Progress Notes (Signed)
HPI:  Here for CPE:  -Concerns and/or follow up today: none PMH ocular migraine, bipolar depression, anxiety, cold sores and ED. Spends most of his time in [place] and sees psychiatrist there for bipolar disorder, eating disorder and disordered sleep habits. Reports he currently is doing well.  He has several new concerns today and understand there can be an additional charge for addressing these at a physical but would like to address.  L shoulder pain: -for about 6 months, avid exerciser and swimmer - notices with abd above 90 degrees mainly -has been starting to interfere with swimming -he wants a referral to ortho -hx remote L clavical fx -denies weakness, numbness, radiation of pain  L hip pain: -rare and and mild, avid runner -rarely feels like it catches -reports he had eval for this and was told look goo on xray so he is not as concerned about this -denies radiation, weakness, clicking, popping  Cold sores: -occur 1-2 times per year and he wants a refill on the valtrex for episodic tx  Skin lesion R temple, occ pruritis, not really changing. Sees dermatologist, declines full skin exam today.   Due for flu shot, labs, colonoscopy, prostate cancer screening discussion. -agrees to flu shot -agrees to lipid panel and hgba1c  -Diet: variety of foods, balance and well rounded, larger portion sizes  -Exercise: regular exercise  -Diabetes and Dyslipidemia Screening:fasting for labs  -Hx of HTN: no  -Vaccines: flu shot  -wants STI testing, Hep C screening (if born 44-1963): no, declined  -FH colon or prstate ca: see FH Last colon cancer screening: 02/2010, normal per report but 5 year repeat advised with better prep per report - his gastroenterologist sent a reminder, but he did not schedule. He reports he did not do this and does not want to. Discussed other options and he wishes to do a cologuard test. Last prostate ca screening: discussed options, limitations,  risks/potential benefits - declined DRE but agreed to PSA  -Alcohol, Tobacco, drug use: see social history  Review of Systems - no fevers, unintentional weight loss, vision loss, hearing loss, chest pain, sob, hemoptysis, melena, hematochezia, hematuria, genital discharge, bleeding, bruising, loc, thoughts of self harm or SI  Past Medical History:  Diagnosis Date  . Acute prostatitis 03/17/2008   Qualifier: Diagnosis of  By: Valma Cava LPN, Izora Gala    . Alcohol abuse, in remission   . 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 GERD (gastroesophageal reflux disease)   . HSV (herpes simplex virus) infection   . ONYCHOMYCOSIS 10/18/2007   Qualifier: Diagnosis of  By: Arnoldo Morale MD, Balinda Quails     No past surgical history on file.  Family History  Problem Relation Age of Onset  . Heart disease Mother     CHF  . Colitis Mother   . Heart disease Father 32    cabg    Social History   Social History  . Marital status: Divorced    Spouse name: N/A  . Number of children: N/A  . Years of education: N/A   Occupational History  . professor [employer]   Social History Main Topics  . Smoking status: Former Smoker    Quit date: 06/23/2000  . Smokeless tobacco: None     Comment: quit in 2000, smoked on and off for a few year  . Alcohol use No  . Drug use: No  . Sexual activity: Yes   Other Topics Concern  .  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  buPROPion (WELLBUTRIN XL) 300 MG 24 hr tablet, Take 1 tablet (300 mg total) by mouth every morning., Disp: 90 tablet, Rfl: 3 .  clonazePAM (KLONOPIN) 0.5 MG tablet, Take 1 tablet (0.5 mg total) by mouth every 6 (six) hours as needed for anxiety., Disp: 60 tablet, Rfl: 3 .  Multiple Vitamin (MULTIVITAMIN) capsule, Take 1  capsule by mouth daily., Disp: , Rfl:  .  tadalafil (CIALIS) 5 MG tablet, Take 1 tablet (5 mg total) by mouth daily as needed for erectile dysfunction., Disp: 5 tablet, Rfl: 5 .  valACYclovir (VALTREX) 500 MG tablet, 1 tablet twice daily for 3 days for outbreak of cold sore., Disp: 6 tablet, Rfl: 5  EXAM:  Vitals:   04/29/16 0823  BP: 98/60  Pulse: (!) 54  Weight: 139 lb 9.6 oz (63.3 kg)  Height: 5\' 9"  (1.753 m)    Estimated body mass index is 20.62 kg/m as calculated from the following:   Height as of this encounter: 5\' 9"  (1.753 m).   Weight as of this encounter: 139 lb 9.6 oz (63.3 kg).  GENERAL: vitals reviewed and listed below, alert, oriented, appears well hydrated and in no acute distress  HEENT: head atraumatic, PERRLA, normal appearance of eyes, ears, nose and mouth. moist mucus membranes.  NECK: supple, no masses or lymphadenopathy  LUNGS: clear to auscultation bilaterally, no rales, rhonchi or wheeze  CV: HRRR, no peripheral edema or cyanosis, normal pedal pulses  ABDOMEN: bowel sounds normal, soft, non tender to palpation, no masses, no rebound or guarding  GU: He declined  SKIN: no rash or abnormal lesions EXCEPT for SK R cheek, 4-5 mm in diameter waxy raised flesh colored lesion L sup shoulder/neck with some scalling  MS: normal gait, normal inspection shoulders/arms/upper back except for head and shoulder forward posture, TTP in the L RTC attachments to the humerus with + impingement test, neg shaw, pain with empty can, but not weakness, normal strength in resisted motions of the shoulder, NV intact distal.  NEURO: normal gait, speech and thought processing grossly intact, muscle tone grossly intact throughout  PSYCH: normal affect, pleasant and cooperative  ASSESSMENT AND PLAN:  Discussed the following assessment and plan: He wished to address several new issues which took time and exam above and beyond a routine physical for discussion, evaluation and  counseling.   Encounter for preventive health examination - Plan: Lipid Panel, Hemoglobin A1c, PSA -Discussed and advised all [place] preventive services health task force level A and B recommendations for age, sex and risks. --Advised at least 150 minutes of exercise per week and a healthy diet with avoidance of (less then 1 serving per week) processed foods, white starches, red meat, fast foods and sweets and consisting of: * 5-9 servings of fresh fruits and vegetables (not corn or potatoes) *nuts and seeds, beans *olives and olive oil *lean meats such as fish and white chicken  *whole grains -FASTING labs, studies and vaccines per orders this encounter  Chronic left shoulder pain Hip pain - Plan: Ambulatory referral to Orthopedic Surgery -we discussed possible serious and likely etiologies, workup and treatment, treatment risks and return precautions, suspect  RTC pathology and he wishes to address promptly as would like to continue to swim -after this discussion, Woodley opted for HEP and referral to [facility] -of course, we advised Mujtaba  to return or notify a doctor immediately if symptoms worsen or persist or new concerns arise. -he is not very concerned about the hip today, but plans to discuss with ortho.  H/O cold sores -rx for valtrex updated per his request and reviewed use for episodic treatment   Skin lesion -lesion on R face is likely SK and discussed options for removal, he wishes to observe -advise re-eval or removal lesion L shoulder to confirm dx, he will consider and plan to see me or dermatologist  Bipolar 1 disorder, mixed ([place]) -sees psychiatrist, reports stable currently  Encounter for immunization - Plan: Flu Vaccine QUAD 36+ mos IM  Patient advised to return to clinic immediately if symptoms worsen or persist or new concerns.  Patient Instructions  BEFORE YOU LEAVE: -labs -rotator cuff exercises -flu shot -cologuard test -follow up: yearly for physical and  in 3 months to recheck the skin lesion on the neck  Vit D3 [phone] IU daily  Use good sun protection  We placed a referral for you as discussed to [facility] for the shoulder pain. It usually takes about 1-2 weeks to process and schedule this referral. If you have not heard from [place] regarding this appointment in 2 weeks please contact our office. In the interim please do the exercises provided 4 days per week.  We have ordered labs or studies at this visit. It can take up to 1-2 weeks for results and processing. IF results require follow up or explanation, we will call you with instructions. Clinically stable results will be released to your [facility]. If you have not heard from [place] or cannot find your results in [facility] in 2 weeks please contact our office at [phone].   If you are not yet signed up for [facility], please SIGN UP TODAY. We now offer online scheduling, same day appointments and extended hours. WHEN YOU DON'T FEEL YOUR BEST.Marland KitchenMarland KitchenWE ARE HERE TO HELP.   We recommend the following healthy lifestyle for LIFE:   Eat a healthy clean diet.  * Tip: Avoid (less then 1 serving per week): processed foods, sweets, sweetened drinks, white starches (rice, flour, bread, potatoes, pasta, etc), red meat, fast foods, butter  *Tip: CHOOSE instead   * 5-9 servings per day of fresh or frozen fruits and vegetables (but not corn, potatoes, bananas, canned or dried fruit)   *nuts and seeds, beans   *olives and olive oil   *small portions of lean meats such as fish and white chicken    *small portions of whole grains  Get at least 150 minutes of sweaty aerobic exercise per week.  Reduce stress - consider counseling, meditation and relaxation to balance other aspects of your life.              No Follow-up on file.   Colin Benton R., DO

## 2016-04-29 ENCOUNTER — Encounter: Payer: BC Managed Care – PPO | Admitting: Family Medicine

## 2016-04-29 ENCOUNTER — Ambulatory Visit (INDEPENDENT_AMBULATORY_CARE_PROVIDER_SITE_OTHER): Payer: BC Managed Care – PPO | Admitting: Family Medicine

## 2016-04-29 ENCOUNTER — Encounter: Payer: Self-pay | Admitting: Family Medicine

## 2016-04-29 VITALS — BP 98/60 | HR 54 | Temp 97.7°F | Ht 69.0 in | Wt 139.6 lb

## 2016-04-29 DIAGNOSIS — M25512 Pain in left shoulder: Secondary | ICD-10-CM | POA: Diagnosis not present

## 2016-04-29 DIAGNOSIS — Z23 Encounter for immunization: Secondary | ICD-10-CM

## 2016-04-29 DIAGNOSIS — Z8619 Personal history of other infectious and parasitic diseases: Secondary | ICD-10-CM | POA: Diagnosis not present

## 2016-04-29 DIAGNOSIS — G8929 Other chronic pain: Secondary | ICD-10-CM

## 2016-04-29 DIAGNOSIS — F3160 Bipolar disorder, current episode mixed, unspecified: Secondary | ICD-10-CM | POA: Diagnosis not present

## 2016-04-29 DIAGNOSIS — L989 Disorder of the skin and subcutaneous tissue, unspecified: Secondary | ICD-10-CM

## 2016-04-29 DIAGNOSIS — Z0000 Encounter for general adult medical examination without abnormal findings: Secondary | ICD-10-CM

## 2016-04-29 LAB — LIPID PANEL
CHOLESTEROL: 145 mg/dL (ref 0–200)
HDL: 55.2 mg/dL (ref >39.00)
LDL Cholesterol: 78 mg/dL (ref 0–99)
NonHDL: 89.96
Total CHOL/HDL Ratio: 3
Triglycerides: 59 mg/dL (ref 0.0–149.0)
VLDL: 11.8 mg/dL (ref 0.0–40.0)

## 2016-04-29 LAB — PSA: PSA: 0.76 ng/mL (ref 0.10–4.00)

## 2016-04-29 LAB — HEMOGLOBIN A1C: Hgb A1c MFr Bld: 5.3 % (ref 4.6–6.5)

## 2016-04-29 MED ORDER — VALACYCLOVIR HCL 500 MG PO TABS: ORAL_TABLET | ORAL | 5 refills | Status: DC

## 2016-04-29 NOTE — Patient Instructions (Signed)
BEFORE YOU LEAVE: -labs -rotator cuff exercises -flu shot -cologuard test -follow up: yearly for physical and in 3 months to recheck the skin lesion on the neck  Vit D3 [phone] IU daily  Use good sun protection  We placed a referral for you as discussed to [facility] for the shoulder pain. It usually takes about 1-2 weeks to process and schedule this referral. If you have not heard from [place] regarding this appointment in 2 weeks please contact our office. In the interim please do the exercises provided 4 days per week.  We have ordered labs or studies at this visit. It can take up to 1-2 weeks for results and processing. IF results require follow up or explanation, we will call you with instructions. Clinically stable results will be released to your [facility]. If you have not heard from [place] or cannot find your results in [facility] in 2 weeks please contact our office at [phone].   If you are not yet signed up for [facility], please SIGN UP TODAY. We now offer online scheduling, same day appointments and extended hours. WHEN YOU DON'T FEEL YOUR BEST.Marland KitchenMarland KitchenWE ARE HERE TO HELP.   We recommend the following healthy lifestyle for LIFE:   Eat a healthy clean diet.  * Tip: Avoid (less then 1 serving per week): processed foods, sweets, sweetened drinks, white starches (rice, flour, bread, potatoes, pasta, etc), red meat, fast foods, butter  *Tip: CHOOSE instead   * 5-9 servings per day of fresh or frozen fruits and vegetables (but not corn, potatoes, bananas, canned or dried fruit)   *nuts and seeds, beans   *olives and olive oil   *small portions of lean meats such as fish and white chicken    *small portions of whole grains  Get at least 150 minutes of sweaty aerobic exercise per week.  Reduce stress - consider counseling, meditation and relaxation to balance other aspects of your life.

## 2016-04-29 NOTE — Progress Notes (Signed)
Pre visit review using our clinic review tool, if applicable. No additional management support is needed unless otherwise documented below in the visit note. 

## 2016-05-08 LAB — COLOGUARD: Cologuard: NEGATIVE

## 2016-05-20 ENCOUNTER — Encounter: Payer: Self-pay | Admitting: Family Medicine

## 2016-12-02 ENCOUNTER — Other Ambulatory Visit: Payer: Self-pay | Admitting: Family Medicine

## 2016-12-26 ENCOUNTER — Other Ambulatory Visit: Payer: Self-pay | Admitting: Family Medicine

## 2017-01-01 ENCOUNTER — Encounter: Payer: Self-pay | Admitting: Family Medicine

## 2017-02-06 ENCOUNTER — Other Ambulatory Visit: Payer: Self-pay | Admitting: Family Medicine

## 2017-02-09 ENCOUNTER — Telehealth: Payer: Self-pay | Admitting: Family Medicine

## 2017-02-09 NOTE — Telephone Encounter (Signed)
PA approved for Cialis. Form faxed back to pharmacy.

## 2017-03-16 ENCOUNTER — Other Ambulatory Visit: Payer: Self-pay | Admitting: Family Medicine

## 2017-04-23 ENCOUNTER — Encounter: Payer: Self-pay | Admitting: Family Medicine

## 2017-04-28 ENCOUNTER — Other Ambulatory Visit: Payer: Self-pay | Admitting: Family Medicine

## 2017-05-01 NOTE — Telephone Encounter (Signed)
#  5 sent to the pharmacy by e-scribe.  Pt has a cpx scheduled with Dr. Maudie Mercury on 05/04/17.

## 2017-05-02 NOTE — Progress Notes (Signed)
HPI:  Here for CPE:  -Concerns and/or follow up today:  PMH Bipolar disorder (sees psychiatrist for management), anxiety, cold sores, migraines and ED. Due for labs.  Reports moving to [place] when retires in a few months. Has PCP there. Parents both passed away recently (in their 17s). Tough, but mood ok otherwise and he is seeing psychiatry for management. Requests refill cialis.  -Diet: variety of foods, balance and well rounded, larger portion sizes -Exercise: no regular exercise -Diabetes and Dyslipidemia Screening: -Hx of HTN: no -Vaccines: UTD -sexual activity: yes, male partner, no new partners -wants STI testing, Hep C screening (if born 34-1963): no -FH colon or prstate ca: see FH Last colon cancer screening: cologuard negative 04/2016; colonoscopy in 2011 -  he refused repeat colonoscopy advised less then ten years after per his report because of poor prep and he opted to cologuard instead. Last prostate ca screening:PSA last year -Alcohol, Tobacco, drug use: see social history  Review of Systems - no fevers, unintentional weight loss, vision loss, hearing loss, chest pain, sob, hemoptysis, melena, hematochezia, hematuria, genital discharge, changing or concerning skin lesions, bleeding, bruising, loc, thoughts of self harm or SI  Past Medical History:  Diagnosis Date  . Acute prostatitis 03/17/2008   Qualifier: Diagnosis of  By: Valma Cava LPN, Izora Gala    . Alcohol abuse, in remission   . 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 GERD (gastroesophageal reflux disease)   . HSV (herpes simplex virus) infection   . ONYCHOMYCOSIS 10/18/2007   Qualifier: Diagnosis of  By: Arnoldo Morale MD, Balinda Quails     History reviewed. No pertinent surgical history.  Family History  Problem Relation Age of Onset  . Heart disease Mother        CHF  . Colitis Mother   . Heart disease Father 15       cabg    Social History   Socioeconomic History  . Marital  status: Divorced    Spouse name: None  . Number of children: None  . Years of education: None  . Highest education level: None  Social Needs  . Financial resource strain: None  . Food insecurity - worry: None  . Food insecurity - inability: None  . Transportation needs - medical: None  . Transportation needs - non-medical: None  Occupational History  . Occupation: professor    Employer: [employer]  Tobacco Use  . Smoking status: Former Smoker    Last attempt to quit: 06/23/2000    Years since quitting: 16.8  . Smokeless tobacco: Never Used  . Tobacco comment: quit in 2000, smoked on and off for a few year  Substance and Sexual Activity  . Alcohol use: No  . Drug use: No  . Sexual activity: Yes  Other Topics Concern  . 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  buPROPion (WELLBUTRIN XL) 300 MG 24 hr tablet, Take 1 tablet (300 mg total) by mouth every morning., Disp: 90 tablet, Rfl: 3 .  clonazePAM (KLONOPIN) 0.5 MG tablet, Take 1 tablet (0.5 mg total) by mouth every 6 (six) hours as needed for anxiety., Disp: 60 tablet, Rfl: 3 .  Multiple Vitamin (MULTIVITAMIN) capsule, Take 1 capsule by mouth daily., Disp: , Rfl:  .  tadalafil (CIALIS) 5 MG tablet, Take 1 tablet (5 mg total) by mouth as needed for erectile dysfunction., Disp: 10 tablet, Rfl: 3 .  valACYclovir (VALTREX) 500 MG tablet, TAKE 1 TABLET TWICE DAILY FOR 3 DAYS FOR OUTBREAK OF COLD SORE., Disp: 6 tablet, Rfl: 2  EXAM:  Vitals:   05/04/17 0810  BP: 122/80  Pulse: (!) 52  Temp: 97.8 F (36.6 C)  TempSrc: Oral  Weight: 140 lb 8 oz (63.7 kg)  Height: 5' 9.25" (1.759 m)    Estimated body mass index is 20.6 kg/m as calculated from the following:   Height as of this encounter: 5' 9.25" (1.759 m).   Weight as of this  encounter: 140 lb 8 oz (63.7 kg).  GENERAL: vitals reviewed and listed below, alert, oriented, appears well hydrated and in no acute distress  HEENT: head atraumatic, PERRLA, normal appearance of eyes, ears, nose and mouth. moist mucus membranes.  NECK: supple, no masses or lymphadenopathy  LUNGS: clear to auscultation bilaterally, no rales, rhonchi or wheeze  CV: HRRR, no peripheral edema or cyanosis, normal pedal pulses  ABDOMEN: bowel sounds normal, soft, non tender to palpation, no masses, no rebound or guarding  GU: declined  SKIN: no rash or abnormal lesions - declined full skin exam  MS: normal gait, moves all extremities normally  NEURO: normal gait, speech and thought processing grossly intact, muscle tone grossly intact throughout  PSYCH: normal affect, pleasant and cooperative  ASSESSMENT AND PLAN:  Discussed the following assessment and plan:  PREVENTIVE EXAM: -Discussed and advised all [place] preventive services health task force level A and B recommendations for age, sex and risks. -Advised at least 150 minutes of exercise per week and a healthy diet with avoidance of (less then 1 serving per week) processed foods, white starches, red meat, fast foods and sweets and consisting of: * 5-9 servings of fresh fruits and vegetables (not corn or potatoes) *nuts and seeds, beans *olives and olive oil *lean meats such as fish and white chicken  *whole grains -labs, studies and vaccines per orders this encounter  Advised to follow up with his psychiatrist/counselor regarding the depression.  Patient Instructions  BEFORE YOU LEAVE: -labs -follow up: with your primary doctor when you move  Follow up with psychiatry about the depression.  We have ordered labs or studies at this visit. It can take up to 1-2 weeks for results and processing. IF results require follow up or explanation, we will call you with instructions. Clinically stable results will be released to your  [facility]. If you have not heard from [place] or cannot find your results in [facility] in 2 weeks please contact our office at [phone].  If you are not yet signed up for [facility], please consider signing up.   Preventive Care 40-64 Years, Male Preventive care refers to lifestyle choices and visits with your health care provider that can promote health and wellness. What does preventive care include?  A yearly physical exam. This is also called an annual well check.  Dental exams once or twice a year.  Routine eye exams. Ask your health care provider how often you should have your eyes checked.  Personal lifestyle choices, including: ? Daily care of your teeth and gums. ? Regular physical activity. ? Eating a healthy diet. ? Avoiding tobacco and drug use. ? Limiting alcohol use. ? Practicing safe sex. ? Taking low-dose aspirin every  day starting at age 43. What happens during an annual well check? The services and screenings done by your health care provider during your annual well check will depend on your age, overall health, lifestyle risk factors, and family history of disease. Counseling Your health care provider may ask you questions about your:  Alcohol use.  Tobacco use.  Drug use.  Emotional well-being.  Home and relationship well-being.  Sexual activity.  Eating habits.  Work and work Statistician.  Screening You may have the following tests or measurements:  Height, weight, and BMI.  Blood pressure.  Lipid and cholesterol levels. These may be checked every 5 years, or more frequently if you are over 41 years old.  Skin check.  Lung cancer screening. You may have this screening every year starting at age 44 if you have a 30-pack-year history of smoking and currently smoke or have quit within the past 15 years.  Fecal occult blood test (FOBT) of the stool. You may have this test every year starting at age 26.  Flexible sigmoidoscopy or colonoscopy. You may  have a sigmoidoscopy every 5 years or a colonoscopy every 10 years starting at age 44.  Prostate cancer screening. Recommendations will vary depending on your family history and other risks.  Hepatitis C blood test.  Hepatitis B blood test.  Sexually transmitted disease (STD) testing.  Diabetes screening. This is done by checking your blood sugar (glucose) after you have not eaten for a while (fasting). You may have this done every 1-3 years.  Discuss your test results, treatment options, and if necessary, the need for more tests with your health care provider. Vaccines Your health care provider may recommend certain vaccines, such as:  Influenza vaccine. This is recommended every year.  Tetanus, diphtheria, and acellular pertussis (Tdap, Td) vaccine. You may need a Td booster every 10 years.  Varicella vaccine. You may need this if you have not been vaccinated.  Zoster vaccine. You may need this after age 86.  Measles, mumps, and rubella (MMR) vaccine. You may need at least one dose of MMR if you were born in 1957 or later. You may also need a second dose.  Pneumococcal 13-valent conjugate (PCV13) vaccine. You may need this if you have certain conditions and have not been vaccinated.  Pneumococcal polysaccharide (PPSV23) vaccine. You may need one or two doses if you smoke cigarettes or if you have certain conditions.  Meningococcal vaccine. You may need this if you have certain conditions.  Hepatitis A vaccine. You may need this if you have certain conditions or if you travel or work in places where you may be exposed to hepatitis A.  Hepatitis B vaccine. You may need this if you have certain conditions or if you travel or work in places where you may be exposed to hepatitis B.  Haemophilus influenzae type b (Hib) vaccine. You may need this if you have certain risk factors.  Talk to your health care provider about which screenings and vaccines you need and how often you need  them. This information is not intended to replace advice given to you by your health care provider. Make sure you discuss any questions you have with your health care provider. Document Released: 04/27/2015 Document Revised: 12/19/2015 Document Reviewed: 01/30/2015 Elsevier Interactive Patient Education  2018 [employer].          No Follow-up on file.   Lucretia Kern, DO

## 2017-05-04 ENCOUNTER — Ambulatory Visit (INDEPENDENT_AMBULATORY_CARE_PROVIDER_SITE_OTHER): Payer: BC Managed Care – PPO | Admitting: Family Medicine

## 2017-05-04 ENCOUNTER — Encounter: Payer: Self-pay | Admitting: Family Medicine

## 2017-05-04 VITALS — BP 122/80 | HR 52 | Temp 97.8°F | Ht 69.25 in | Wt 140.5 lb

## 2017-05-04 DIAGNOSIS — Z0000 Encounter for general adult medical examination without abnormal findings: Secondary | ICD-10-CM | POA: Diagnosis not present

## 2017-05-04 DIAGNOSIS — Z1331 Encounter for screening for depression: Secondary | ICD-10-CM | POA: Diagnosis not present

## 2017-05-04 LAB — CHOLESTEROL, TOTAL: CHOLESTEROL: 141 mg/dL (ref 0–200)

## 2017-05-04 LAB — HEMOGLOBIN A1C: Hgb A1c MFr Bld: 5.3 % (ref 4.6–6.5)

## 2017-05-04 LAB — HDL CHOLESTEROL: HDL: 57.6 mg/dL (ref >39.00)

## 2017-05-04 MED ORDER — TADALAFIL 5 MG PO TABS: 5.0000 mg | ORAL_TABLET | ORAL | 3 refills | Status: DC | PRN

## 2017-05-04 NOTE — Patient Instructions (Addendum)
BEFORE YOU LEAVE: -labs -follow up: with your primary doctor when you move  Follow up with psychiatry about the depression.  We have ordered labs or studies at this visit. It can take up to 1-2 weeks for results and processing. IF results require follow up or explanation, we will call you with instructions. Clinically stable results will be released to your [facility]. If you have not heard from [place] or cannot find your results in [facility] in 2 weeks please contact our office at [phone].  If you are not yet signed up for [facility], please consider signing up.   Preventive Care 40-64 Years, Male Preventive care refers to lifestyle choices and visits with your health care provider that can promote health and wellness. What does preventive care include?  A yearly physical exam. This is also called an annual well check.  Dental exams once or twice a year.  Routine eye exams. Ask your health care provider how often you should have your eyes checked.  Personal lifestyle choices, including: ? Daily care of your teeth and gums. ? Regular physical activity. ? Eating a healthy diet. ? Avoiding tobacco and drug use. ? Limiting alcohol use. ? Practicing safe sex. ? Taking low-dose aspirin every day starting at age 67. What happens during an annual well check? The services and screenings done by your health care provider during your annual well check will depend on your age, overall health, lifestyle risk factors, and family history of disease. Counseling Your health care provider may ask you questions about your:  Alcohol use.  Tobacco use.  Drug use.  Emotional well-being.  Home and relationship well-being.  Sexual activity.  Eating habits.  Work and work Statistician.  Screening You may have the following tests or measurements:  Height, weight, and BMI.  Blood pressure.  Lipid and cholesterol levels. These may be checked every 5 years, or more frequently if you are over 25  years old.  Skin check.  Lung cancer screening. You may have this screening every year starting at age 71 if you have a 30-pack-year history of smoking and currently smoke or have quit within the past 15 years.  Fecal occult blood test (FOBT) of the stool. You may have this test every year starting at age 87.  Flexible sigmoidoscopy or colonoscopy. You may have a sigmoidoscopy every 5 years or a colonoscopy every 10 years starting at age 61.  Prostate cancer screening. Recommendations will vary depending on your family history and other risks.  Hepatitis C blood test.  Hepatitis B blood test.  Sexually transmitted disease (STD) testing.  Diabetes screening. This is done by checking your blood sugar (glucose) after you have not eaten for a while (fasting). You may have this done every 1-3 years.  Discuss your test results, treatment options, and if necessary, the need for more tests with your health care provider. Vaccines Your health care provider may recommend certain vaccines, such as:  Influenza vaccine. This is recommended every year.  Tetanus, diphtheria, and acellular pertussis (Tdap, Td) vaccine. You may need a Td booster every 10 years.  Varicella vaccine. You may need this if you have not been vaccinated.  Zoster vaccine. You may need this after age 81.  Measles, mumps, and rubella (MMR) vaccine. You may need at least one dose of MMR if you were born in 1957 or later. You may also need a second dose.  Pneumococcal 13-valent conjugate (PCV13) vaccine. You may need this if you have certain conditions and  have not been vaccinated.  Pneumococcal polysaccharide (PPSV23) vaccine. You may need one or two doses if you smoke cigarettes or if you have certain conditions.  Meningococcal vaccine. You may need this if you have certain conditions.  Hepatitis A vaccine. You may need this if you have certain conditions or if you travel or work in places where you may be exposed to  hepatitis A.  Hepatitis B vaccine. You may need this if you have certain conditions or if you travel or work in places where you may be exposed to hepatitis B.  Haemophilus influenzae type b (Hib) vaccine. You may need this if you have certain risk factors.  Talk to your health care provider about which screenings and vaccines you need and how often you need them. This information is not intended to replace advice given to you by your health care provider. Make sure you discuss any questions you have with your health care provider. Document Released: 04/27/2015 Document Revised: 12/19/2015 Document Reviewed: 01/30/2015 [employer]vier Interactive Patient Education  [employer].

## 2017-05-20 ENCOUNTER — Other Ambulatory Visit: Payer: Self-pay | Admitting: Family Medicine

## 2017-12-08 ENCOUNTER — Other Ambulatory Visit: Payer: Self-pay | Admitting: Family Medicine

## 2017-12-09 ENCOUNTER — Encounter: Payer: Self-pay | Admitting: Family Medicine
